# Patient Record
Sex: Female | Born: 1990 | Race: Black or African American | Hispanic: No | Marital: Single | Smoking: Former smoker
Health system: Southern US, Community
[De-identification: ages and names within clinical notes are randomized; demographics above are authoritative.]

## PROBLEM LIST (undated history)

## (undated) ENCOUNTER — Inpatient Hospital Stay (HOSPITAL_COMMUNITY): Payer: Self-pay

## (undated) DIAGNOSIS — F329 Major depressive disorder, single episode, unspecified: Secondary | ICD-10-CM

## (undated) DIAGNOSIS — Z9289 Personal history of other medical treatment: Secondary | ICD-10-CM

## (undated) DIAGNOSIS — A749 Chlamydial infection, unspecified: Secondary | ICD-10-CM

## (undated) DIAGNOSIS — E669 Obesity, unspecified: Secondary | ICD-10-CM

## (undated) DIAGNOSIS — F32A Depression, unspecified: Secondary | ICD-10-CM

## (undated) DIAGNOSIS — Z8619 Personal history of other infectious and parasitic diseases: Secondary | ICD-10-CM

## (undated) DIAGNOSIS — Z8614 Personal history of Methicillin resistant Staphylococcus aureus infection: Secondary | ICD-10-CM

## (undated) DIAGNOSIS — IMO0002 Reserved for concepts with insufficient information to code with codable children: Secondary | ICD-10-CM

## (undated) DIAGNOSIS — N390 Urinary tract infection, site not specified: Secondary | ICD-10-CM

## (undated) DIAGNOSIS — D649 Anemia, unspecified: Secondary | ICD-10-CM

## (undated) DIAGNOSIS — R059 Cough, unspecified: Secondary | ICD-10-CM

## (undated) DIAGNOSIS — O1490 Unspecified pre-eclampsia, unspecified trimester: Secondary | ICD-10-CM

## (undated) DIAGNOSIS — R05 Cough: Secondary | ICD-10-CM

## (undated) HISTORY — PX: HERNIA REPAIR: SHX51

## (undated) HISTORY — PX: CHOLECYSTECTOMY: SHX55

## (undated) HISTORY — DX: Depression, unspecified: F32.A

## (undated) HISTORY — DX: Personal history of other infectious and parasitic diseases: Z86.19

## (undated) HISTORY — DX: Major depressive disorder, single episode, unspecified: F32.9

## (undated) HISTORY — DX: Unspecified pre-eclampsia, unspecified trimester: O14.90

## (undated) HISTORY — DX: Anemia, unspecified: D64.9

## (undated) HISTORY — DX: Reserved for concepts with insufficient information to code with codable children: IMO0002

---
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
## 2009-10-30 HISTORY — PX: WISDOM TOOTH EXTRACTION: SHX21

## 2009-12-28 HISTORY — PX: DILATION AND CURETTAGE OF UTERUS: SHX78

## 2011-08-08 ENCOUNTER — Encounter (HOSPITAL_COMMUNITY): Payer: Self-pay | Admitting: *Deleted

## 2011-08-08 ENCOUNTER — Inpatient Hospital Stay (HOSPITAL_COMMUNITY)
Admission: AD | Admit: 2011-08-08 | Discharge: 2011-08-09 | Disposition: A | Discharge status: Home / Self Care | Payer: Medicaid Other | Source: Ambulatory Visit | Attending: Obstetrics & Gynecology | Admitting: Obstetrics & Gynecology

## 2011-08-08 DIAGNOSIS — B373 Candidiasis of vulva and vagina: Secondary | ICD-10-CM

## 2011-08-08 DIAGNOSIS — O2390 Unspecified genitourinary tract infection in pregnancy, unspecified trimester: Secondary | ICD-10-CM | POA: Insufficient documentation

## 2011-08-08 DIAGNOSIS — B3731 Acute candidiasis of vulva and vagina: Secondary | ICD-10-CM | POA: Insufficient documentation

## 2011-08-08 DIAGNOSIS — N949 Unspecified condition associated with female genital organs and menstrual cycle: Secondary | ICD-10-CM | POA: Insufficient documentation

## 2011-08-08 LAB — URINALYSIS, ROUTINE W REFLEX MICROSCOPIC
Hgb urine dipstick: NEGATIVE
Leukocytes, UA: NEGATIVE
Nitrite: NEGATIVE

## 2011-08-08 LAB — CBC
HCT: 33.1 % — ABNORMAL LOW (ref 36.0–46.0)
Hemoglobin: 11.1 g/dL — ABNORMAL LOW (ref 12.0–15.0)
MCH: 29.5 pg (ref 26.0–34.0)
MCHC: 33.5 g/dL (ref 30.0–36.0)
MCV: 88 fL (ref 78.0–100.0)

## 2011-08-08 NOTE — Progress Notes (Signed)
PT SAYS THAT SAT AND SUN AM- VAGINA HURTS.   TODAY NOTICED   SLIGHT RED BLEEDING. NO CRAMPING.  PAINFUL TO WALK.

## 2011-08-09 ENCOUNTER — Inpatient Hospital Stay (HOSPITAL_COMMUNITY): Payer: Medicaid Other

## 2011-08-09 LAB — GC/CHLAMYDIA PROBE AMP, GENITAL
Chlamydia, DNA Probe: NEGATIVE
GC Probe Amp, Genital: NEGATIVE

## 2011-08-09 LAB — WET PREP, GENITAL: Yeast Wet Prep HPF POC: NONE SEEN

## 2011-08-09 MED ORDER — FLUCONAZOLE 150 MG PO TABS: 150.0000 mg | ORAL_TABLET | Freq: Once | ORAL | Status: AC

## 2011-08-09 NOTE — ED Provider Notes (Signed)
Attestation of Attending Supervision of Advanced Practitioner: Evaluation and management procedures were performed by the PA/NP/CNM/OB Fellow under my supervision/collaboration. Chart reviewed and agree with management and plan.  Philopateer Strine A 08/09/2011 7:55 AM

## 2011-08-09 NOTE — Progress Notes (Signed)
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

## 2011-08-09 NOTE — ED Provider Notes (Signed)
History   Pt presents today c/o vag spotting that began earlier today and vag pain. She denies fever, vag dc, or abd pain. She denies recent intercourse. She is currently 19.2wks.  No chief complaint on file.  HPI  OB History    Grav Para Term Preterm Abortions TAB SAB Ect Mult Living   3    2  2    0      No past medical history on file.  No past surgical history on file.  No family history on file.  History  Substance Use Topics  . Smoking status: Not on file  . Smokeless tobacco: Not on file  . Alcohol Use: Not on file    Allergies: No Known Allergies  Prescriptions prior to admission  Medication Sig Dispense Refill  . prenatal vitamin w/FE, FA (PRENATAL 1 + 1) 27-1 MG TABS Take 1 tablet by mouth daily.          Review of Systems  Constitutional: Negative for fever.  Cardiovascular: Negative for chest pain.  Gastrointestinal: Negative for nausea, vomiting, abdominal pain, diarrhea and constipation.  Genitourinary: Negative for dysuria, urgency, frequency and hematuria.  Neurological: Negative for dizziness and headaches.  Psychiatric/Behavioral: Negative for depression and suicidal ideas.   Physical Exam   Blood pressure 125/43, pulse 89, temperature 98.4 F (36.9 C), temperature source Oral, height 5' (1.524 m), weight 245 lb 4 oz (111.245 kg).  Physical Exam  Constitutional: She is oriented to person, place, and time. She appears well-developed and well-nourished. No distress.  HENT:  Head: Normocephalic and atraumatic.  Eyes: EOM are normal. Pupils are equal, round, and reactive to light.  GI: Soft. She exhibits no distension. There is no tenderness. There is no rebound and no guarding.  Genitourinary: No bleeding around the vagina. Vaginal discharge found.       Cervix Lg/closed. Thick yellow/green vag dc present.  Neurological: She is alert and oriented to person, place, and time.  Skin: Skin is warm and dry. She is not diaphoretic.  Psychiatric: She has  a normal mood and affect. Her behavior is normal. Judgment and thought content normal.    MAU Course  Procedures  Wet prep, GC/Chlamydia cultures done.  Results for orders placed during the hospital encounter of 08/08/11 (from the past 24 hour(s))  CBC     Status: Abnormal   Collection Time   08/08/11 10:06 PM      Component Value Range   WBC 9.9  4.0 - 10.5 (K/uL)   RBC 3.76 (*) 3.87 - 5.11 (MIL/uL)   Hemoglobin 11.1 (*) 12.0 - 15.0 (g/dL)   HCT 29.5 (*) 62.1 - 46.0 (%)   MCV 88.0  78.0 - 100.0 (fL)   MCH 29.5  26.0 - 34.0 (pg)   MCHC 33.5  30.0 - 36.0 (g/dL)   RDW 30.8  65.7 - 84.6 (%)   Platelets 202  150 - 400 (K/uL)  ABO/RH     Status: Normal   Collection Time   08/08/11 10:06 PM      Component Value Range   ABO/RH(D) A POS    URINALYSIS, ROUTINE W REFLEX MICROSCOPIC     Status: Abnormal   Collection Time   08/08/11 10:46 PM      Component Value Range   Color, Urine YELLOW  YELLOW    Appearance CLEAR  CLEAR    Specific Gravity, Urine >1.030 (*) 1.005 - 1.030    pH 6.0  5.0 - 8.0    Glucose,  UA NEGATIVE  NEGATIVE (mg/dL)   Hgb urine dipstick NEGATIVE  NEGATIVE    Bilirubin Urine NEGATIVE  NEGATIVE    Ketones, ur 15 (*) NEGATIVE (mg/dL)   Protein, ur NEGATIVE  NEGATIVE (mg/dL)   Urobilinogen, UA 0.2  0.0 - 1.0 (mg/dL)   Nitrite NEGATIVE  NEGATIVE    Leukocytes, UA NEGATIVE  NEGATIVE   WET PREP, GENITAL     Status: Abnormal   Collection Time   08/09/11 12:01 AM      Component Value Range   Yeast, Wet Prep NONE SEEN  NONE SEEN    Trich, Wet Prep NONE SEEN  NONE SEEN    Clue Cells, Wet Prep NONE SEEN  NONE SEEN    WBC, Wet Prep HPF POC MODERATE (*) NONE SEEN    US shows single IUP with no previa or abruption noted. Assessment and Plan  Preg: discussed with pt at length. Discussed diet, activity, risks, and precautions. She will begin prenatal care. Will tx yeast prophylactically as vag dc is clinically consistent with yeast. Will give Rx for diflucan.   Clinton Gallant.  Jairy Angulo III, DrHSc, MPAS, PA-C  08/09/2011, 12:09 AM   Henrietta Hoover, PA 08/09/11 1610

## 2011-10-25 ENCOUNTER — Inpatient Hospital Stay (HOSPITAL_COMMUNITY)
Admission: AD | Admit: 2011-10-25 | Discharge: 2011-10-25 | Disposition: A | Discharge status: Home / Self Care | Payer: Medicaid Other | Source: Ambulatory Visit | Attending: Obstetrics and Gynecology | Admitting: Obstetrics and Gynecology

## 2011-10-25 ENCOUNTER — Encounter (HOSPITAL_COMMUNITY): Payer: Self-pay

## 2011-10-25 DIAGNOSIS — W010XXA Fall on same level from slipping, tripping and stumbling without subsequent striking against object, initial encounter: Secondary | ICD-10-CM | POA: Insufficient documentation

## 2011-10-25 DIAGNOSIS — O368190 Decreased fetal movements, unspecified trimester, not applicable or unspecified: Secondary | ICD-10-CM | POA: Diagnosis present

## 2011-10-25 DIAGNOSIS — J45909 Unspecified asthma, uncomplicated: Secondary | ICD-10-CM | POA: Diagnosis present

## 2011-10-25 DIAGNOSIS — O99891 Other specified diseases and conditions complicating pregnancy: Secondary | ICD-10-CM | POA: Insufficient documentation

## 2011-10-25 DIAGNOSIS — E669 Obesity, unspecified: Secondary | ICD-10-CM | POA: Diagnosis present

## 2011-10-25 NOTE — Progress Notes (Signed)
S: pt feels lots of FM now, no other c/o O: FHR 150 with accels, no decels CAT I toco quiet A: s/p fall, EFM x4 hours is reassuring P: d/c home, [facility], f/u as scheduled at office

## 2011-10-25 NOTE — ED Notes (Signed)
History   20 yo g1p0 EDC 3/3 transfer of care with no records available, presents from the office c/o fell at 1130 slipped because it was wet outside and landed on my butt. No c/o of srom, vag bleeding, with decreased fm.  pregnancy significant for:  obesity  asthma Chief Complaint  Patient presents with  . Fall     OB History    Grav Para Term Preterm Abortions TAB SAB Ect Mult Living   3 0 0 0 2 0 2 0 0 0       Past Medical History  Diagnosis Date  . Asthma     Past Surgical History  Procedure Date  . No past surgeries     History reviewed. No pertinent family history.  History  Substance Use Topics  . Smoking status: Never Smoker   . Smokeless tobacco: Not on file  . Alcohol Use: No    Allergies: No Known Allergies  Prescriptions prior to admission  Medication Sig Dispense Refill  . prenatal vitamin w/FE, FA (PRENATAL 1 + 1) 27-1 MG TABS Take 1 tablet by mouth daily.           Physical Exam   Blood pressure 106/55, pulse 96, temperature 96.7 F (35.9 C), temperature source Oral, resp. rate 18, height 5' (1.524 m), weight 114.76 kg (253 lb).  abd soft, gravid, nt Fhts 140s LTV mod UC none Vag not assessed +1 edema lower legs  ED Course  Assessment:  30 3/7 week IUP post fall P prolonged monitoring. Laura Austin, CNM

## 2011-10-25 NOTE — Progress Notes (Signed)
Patient states slipped and fell on bottom this morning. Denies vaginal bleeding or leaking of fluid but does states some decreased fetal movement since fall.

## 2011-10-25 NOTE — Progress Notes (Signed)
Pt stated she slipped and fell on her back side this morning around 1100 am denies Hitting her stomach. Went to her normal prenatal visit and told CNM that she had not felt the baby move much since the fall. They put baby on monitor and FHR was "low" and she was told to go to the hospital for further monitoring.

## 2011-12-29 ENCOUNTER — Encounter (HOSPITAL_COMMUNITY): Payer: Self-pay | Admitting: *Deleted

## 2011-12-29 ENCOUNTER — Observation Stay (HOSPITAL_COMMUNITY)
Admission: AD | Admit: 2011-12-29 | Discharge: 2011-12-30 | Disposition: A | Discharge status: Home / Self Care | Payer: Medicaid Other | Source: Ambulatory Visit | Attending: Obstetrics and Gynecology | Admitting: Obstetrics and Gynecology

## 2011-12-29 ENCOUNTER — Inpatient Hospital Stay (HOSPITAL_COMMUNITY): Payer: Medicaid Other

## 2011-12-29 ENCOUNTER — Other Ambulatory Visit: Payer: Self-pay

## 2011-12-29 DIAGNOSIS — O99891 Other specified diseases and conditions complicating pregnancy: Secondary | ICD-10-CM | POA: Insufficient documentation

## 2011-12-29 DIAGNOSIS — E669 Obesity, unspecified: Secondary | ICD-10-CM

## 2011-12-29 DIAGNOSIS — Z22330 Carrier of Group B streptococcus: Secondary | ICD-10-CM | POA: Insufficient documentation

## 2011-12-29 DIAGNOSIS — J45909 Unspecified asthma, uncomplicated: Secondary | ICD-10-CM | POA: Diagnosis present

## 2011-12-29 DIAGNOSIS — IMO0002 Reserved for concepts with insufficient information to code with codable children: Secondary | ICD-10-CM

## 2011-12-29 DIAGNOSIS — O368390 Maternal care for abnormalities of the fetal heart rate or rhythm, unspecified trimester, not applicable or unspecified: Secondary | ICD-10-CM | POA: Insufficient documentation

## 2011-12-29 DIAGNOSIS — O0930 Supervision of pregnancy with insufficient antenatal care, unspecified trimester: Secondary | ICD-10-CM

## 2011-12-29 DIAGNOSIS — O368190 Decreased fetal movements, unspecified trimester, not applicable or unspecified: Principal | ICD-10-CM | POA: Insufficient documentation

## 2011-12-29 LAB — HEPATITIS B SURFACE ANTIGEN: Hepatitis B Surface Ag: NEGATIVE

## 2011-12-29 LAB — RUBELLA ANTIBODY, IGM: Rubella: IMMUNE

## 2011-12-29 MED ORDER — CALCIUM CARBONATE ANTACID 500 MG PO CHEW: 2.0000 | CHEWABLE_TABLET | ORAL | Status: DC | PRN

## 2011-12-29 MED ORDER — DOCUSATE SODIUM 100 MG PO CAPS
100.0000 mg | ORAL_CAPSULE | Freq: Every day | ORAL | Status: DC
  Administered 2011-12-30: 100 mg via ORAL
  Filled 2011-12-29: qty 1

## 2011-12-29 MED ORDER — ZOLPIDEM TARTRATE 10 MG PO TABS
10.0000 mg | ORAL_TABLET | Freq: Every evening | ORAL | Status: DC | PRN
  Administered 2011-12-29: 10 mg via ORAL
  Filled 2011-12-29: qty 1

## 2011-12-29 MED ORDER — ACETAMINOPHEN 325 MG PO TABS: 650.0000 mg | ORAL_TABLET | ORAL | Status: DC | PRN

## 2011-12-29 MED ORDER — PRENATAL MULTIVITAMIN CH
1.0000 | ORAL_TABLET | Freq: Every day | ORAL | Status: DC
  Filled 2011-12-29: qty 1

## 2011-12-29 NOTE — ED Provider Notes (Signed)
History     No chief complaint on file.  HPI Comments: Pt is a G3P0 at [redacted]w[redacted]d with c/o decreased FM. Pt states baby only moves when she eats or drinks and this is different than usual. Pt denies any ctx, VB or LOF, Pt was to have a colpo at 36wks but has not had an appt since 35wks. Pt states she was waiting for office to call.  Pregnancy significant for: 1. LTC/lapse in care 2. +GBS in urine 3. abnl pap - needs colpo 4. Asthma      Past Medical History  Diagnosis Date  . Asthma     Past Surgical History  Procedure Date  . No past surgeries     Family History  Problem Relation Age of Onset  . Hypertension Mother   . Stroke Maternal Grandmother   . Diabetes Maternal Grandfather   . Cancer Paternal Grandfather     History  Substance Use Topics  . Smoking status: Former Smoker    Quit date: 02/28/2011  . Smokeless tobacco: Not on file  . Alcohol Use: No    Allergies: No Known Allergies  Prescriptions prior to admission  Medication Sig Dispense Refill  . Prenatal Vit-Fe Fumarate-FA (PRENATAL MULTIVITAMIN) TABS Take 1 tablet by mouth daily.        Review of Systems  All other systems reviewed and are negative.   Physical Exam   Blood pressure 110/81, pulse 104, temperature 98.7 F (37.1 C), temperature source Oral, resp. rate 18, height 5' (1.524 m), weight 121.677 kg (268 lb 4 oz), SpO2 98.00%.  Physical Exam  Nursing note and vitals reviewed. Constitutional: She is oriented to person, place, and time. She appears well-developed and well-nourished. No distress.  HENT:  Head: Normocephalic.  Neck: Normal range of motion.  Cardiovascular: Normal rate.   Respiratory: Effort normal.  GI: Soft.  Genitourinary:       deferred  Musculoskeletal: Normal range of motion.  Neurological: She is alert and oriented to person, place, and time.  Skin: Skin is warm and dry.  Psychiatric: She has a normal mood and affect. Her behavior is normal.   FHR 140  non-reactive 1 15x15 accel toco quiet MAU Course  Procedures    Assessment and Plan  IUP at [redacted]w[redacted]d Non-reactive NST - will get BPP and check AFI +GBS   Locke Barrell M 12/29/2011, 6:03 PM
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
## 2011-12-30 ENCOUNTER — Observation Stay (HOSPITAL_COMMUNITY): Payer: Medicaid Other

## 2011-12-30 NOTE — Discharge Instructions (Signed)
Gestational Diabetes Mellitus Gestational diabetes mellitus (GDM) is diabetes that occurs only during pregnancy. This happens when the body cannot properly handle the glucose (sugar) that increases in the blood after eating. During pregnancy, insulin resistance (reduced sensitivity to insulin) occurs because of the release of hormones from the placenta. Usually, the pancreas of pregnant women produces enough insulin to overcome the resistance that occurs. However, in gestational diabetes, the insulin is there but it does not work effectively. If the resistance is severe enough that the pancreas does not produce enough insulin, extra glucose builds up in the blood.  WHO IS AT RISK FOR DEVELOPING GESTATIONAL DIABETES?  Women with a history of diabetes in the family.   Women over age 25.   Women who are overweight.   Women in certain ethnic groups (Hispanic, African American, Native American, Asian and Pacific Islander).  WHAT CAN HAPPEN TO THE BABY? If the mother's blood glucose is too high while she is pregnant, the extra sugar will travel through the umbilical cord to the baby. Some of the problems the baby may have are:  Large Baby - If the baby receives too much sugar, the baby will gain more weight. This may cause the baby to be too large to be born normally (vaginally) and a Cesarean section (C-section) may be needed.   Low Blood Glucose (hypoglycemia) - The baby makes extra insulin, in response to the extra sugar its gets from its mother. When the baby is born and no longer needs this extra insulin, the baby's blood glucose level may drop.   Jaundice (yellow coloring of the skin and eyes) - This is fairly common in babies. It is caused from a build-up of the chemical called bilirubin. This is rarely serious, but is seen more often in babies whose mothers had gestational diabetes.  RISKS TO THE MOTHER Women who have had gestational diabetes may be at higher risk for some problems,  including:  Preeclampsia or toxemia, which includes problems with high blood pressure. Blood pressure and protein levels in the urine must be checked frequently.   Infections.   Cesarean section (C-section) for delivery.   Developing Type 2 diabetes later in life. About 30-50% will develop diabetes later, especially if obese.  DIAGNOSIS  The hormones that cause insulin resistance are highest at about 24-28 weeks of pregnancy. If symptoms are experienced, they are much like symptoms you would normally expect during pregnancy.  GDM is often diagnosed using a two part method: 1. After 24-28 weeks of pregnancy, the woman drinks a glucose solution and takes a blood test. If the glucose level is high, a second test will be given.  2. Oral Glucose Tolerance Test (OGTT) which is 3 hours long - After not eating overnight, the blood glucose is checked. The woman drinks a glucose solution, and hourly blood glucose tests are taken.  If the woman has risk factors for GDM, the caregiver may test earlier than 24 weeks of pregnancy. TREATMENT  Treatment of GDM is directed at keeping the mother's blood glucose level normal, and may include:  Meal planning.   Taking insulin or other medicine to control your blood glucose level.   Exercise.   Keeping a daily record of the foods you eat.   Blood glucose monitoring and keeping a record of your blood glucose levels.   May monitor ketone levels in the urine, although this is no longer considered necessary in most pregnancies.  HOME CARE INSTRUCTIONS  While you are pregnant:    Follow your caregiver's advice regarding your prenatal appointments, meal planning, exercise, medicines, vitamins, blood and other tests, and physical activities.   Keep a record of your meals, blood glucose tests, and the amount of insulin you are taking (if any). Show this to your caregiver at every prenatal visit.   If you have GDM, you may have problems with hypoglycemia (low  blood glucose). You may suspect this if you become suddenly dizzy, feel shaky, and/or weak. If you think this is happening and you have a glucose meter, try to test your blood glucose level. Follow your caregiver's advice for when and how to treat your low blood glucose. Generally, the 15:15 rule is followed: Treat by consuming 15 grams of carbohydrates, wait 15 minutes, and recheck blood glucose. Examples of 15 grams of carbohydrates are:   1 cup skim or low-fat milk.    cup juice.   3-4 glucose tablets.   5-6 hard candies.   1 small box raisins.    cup regular soda pop.   Practice good hygiene, to avoid infections.   Do not smoke.  SEEK MEDICAL CARE IF:   You develop abnormal vaginal discharge, with or without itching.   You become weak and tired more than expected.   You seem to sweat a lot.   You have a sudden increase in weight, 5 pounds or more in one week.   You are losing weight, 3 pounds or more in a week.   Your blood glucose level is high, and you need instructions on what to do about it.  SEEK IMMEDIATE MEDICAL CARE IF:   You develop a severe headache.   You faint or pass out.   You develop nausea and vomiting.   You become disoriented or confused.   You have a convulsion.   You develop vision problems.   You develop stomach pain.   You develop vaginal bleeding.   You develop uterine contractions.   You have leaking or a gush of fluid from the vagina.  AFTER YOU HAVE THE BABY:  Go to all of your follow-up appointments, and have blood tests as advised by your caregiver.   Maintain a healthy lifestyle, to prevent diabetes in the future. This includes:   Following a healthy meal plan.   Controlling your weight.   Getting enough exercise and proper rest.   Do not smoke.   Breastfeed your baby if you can. This will lower the chance of you and your baby developing diabetes later in life.  For more information about diabetes, go to the American  Diabetes Association at: PMFashions.com.cy. For more information about gestational diabetes, go to the [employer] at: RentRule.com.au. Document Released: 01/22/2001 Document Revised: 06/28/2011 Document Reviewed: 08/16/2009 [facility] Patient Information 2012 [place], [place].Fetal Movement Counts Patient Name: __________________________________________________ Patient Due Date: ____________________ Melody Haver counts is highly recommended in high risk pregnancies, but it is a good idea for every pregnant woman to do. Start counting fetal movements at 28 weeks of the pregnancy. Fetal movements increase after eating a full meal or eating or drinking something sweet (the blood sugar is higher). It is also important to drink plenty of fluids (well hydrated) before doing the count. Lie on your left side because it helps with the circulation or you can sit in a comfortable chair with your arms over your belly (abdomen) with no distractions around you. DOING THE COUNT  Try to do the count the same time of day each time you  do it.   Loraine Leriche the day and time, then see how long it takes for you to feel 10 movements (kicks, flutters, swishes, rolls). You should have at least 10 movements within 2 hours. You will most likely feel 10 movements in much less than 2 hours. If you do not, wait an hour and count again. After a couple of days you will see a pattern.   What you are looking for is a change in the pattern or not enough counts in 2 hours. Is it taking longer in time to reach 10 movements?  SEEK MEDICAL CARE IF:  You feel less than 10 counts in 2 hours. Tried twice.   No movement in one hour.   The pattern is changing or taking longer each day to reach 10 counts in 2 hours.   You feel the baby is not moving as it usually does.  Date: ____________ Movements: ____________ Start time: ____________ Doreatha Martin time: ____________  Date: ____________ Movements:  ____________ Start time: ____________ Doreatha Martin time: ____________ Date: ____________ Movements: ____________ Start time: ____________ Doreatha Martin time: ____________ Date: ____________ Movements: ____________ Start time: ____________ Doreatha Martin time: ____________ Date: ____________ Movements: ____________ Start time: ____________ Doreatha Martin time: ____________ Date: ____________ Movements: ____________ Start time: ____________ Doreatha Martin time: ____________ Date: ____________ Movements: ____________ Start time: ____________ Doreatha Martin time: ____________ Date: ____________ Movements: ____________ Start time: ____________ Doreatha Martin time: ____________  Date: ____________ Movements: ____________ Start time: ____________ Doreatha Martin time: ____________ Date: ____________ Movements: ____________ Start time: ____________ Doreatha Martin time: ____________ Date: ____________ Movements: ____________ Start time: ____________ Doreatha Martin time: ____________ Date: ____________ Movements: ____________ Start time: ____________ Doreatha Martin time: ____________ Date: ____________ Movements: ____________ Start time: ____________ Doreatha Martin time: ____________ Date: ____________ Movements: ____________ Start time: ____________ Doreatha Martin time: ____________ Date: ____________ Movements: ____________ Start time: ____________ Doreatha Martin time: ____________  Date: ____________ Movements: ____________ Start time: ____________ Doreatha Martin time: ____________ Date: ____________ Movements: ____________ Start time: ____________ Doreatha Martin time: ____________ Date: ____________ Movements: ____________ Start time: ____________ Doreatha Martin time: ____________ Date: ____________ Movements: ____________ Start time: ____________ Doreatha Martin time: ____________ Date: ____________ Movements: ____________ Start time: ____________ Doreatha Martin time: ____________ Date: ____________ Movements: ____________ Start time: ____________ Doreatha Martin time: ____________ Date: ____________ Movements: ____________ Start time: ____________ Doreatha Martin  time: ____________  Date: ____________ Movements: ____________ Start time: ____________ Doreatha Martin time: ____________ Date: ____________ Movements: ____________ Start time: ____________ Doreatha Martin time: ____________ Date: ____________ Movements: ____________ Start time: ____________ Doreatha Martin time: ____________ Date: ____________ Movements: ____________ Start time: ____________ Doreatha Martin time: ____________ Date: ____________ Movements: ____________ Start time: ____________ Doreatha Martin time: ____________ Date: ____________ Movements: ____________ Start time: ____________ Doreatha Martin time: ____________ Date: ____________ Movements: ____________ Start time: ____________ Doreatha Martin time: ____________  Date: ____________ Movements: ____________ Start time: ____________ Doreatha Martin time: ____________ Date: ____________ Movements: ____________ Start time: ____________ Doreatha Martin time: ____________ Date: ____________ Movements: ____________ Start time: ____________ Doreatha Martin time: ____________ Date: ____________ Movements: ____________ Start time: ____________ Doreatha Martin time: ____________ Date: ____________ Movements: ____________ Start time: ____________ Doreatha Martin time: ____________ Date: ____________ Movements: ____________ Start time: ____________ Doreatha Martin time: ____________ Date: ____________ Movements: ____________ Start time: ____________ Doreatha Martin time: ____________  Date: ____________ Movements: ____________ Start time: ____________ Doreatha Martin time: ____________ Date: ____________ Movements: ____________ Start time: ____________ Doreatha Martin time: ____________ Date: ____________ Movements: ____________ Start time: ____________ Doreatha Martin time: ____________ Date: ____________ Movements: ____________ Start time: ____________ Doreatha Martin time: ____________ Date: ____________ Movements: ____________ Start time: ____________ Doreatha Martin time: ____________ Date: ____________ Movements: ____________ Start time: ____________ Doreatha Martin time: ____________ Date: ____________  Movements: ____________ Start time: ____________ Doreatha Martin time: ____________  Date: ____________ Movements: ____________ Start time:  ____________ Doreatha Martin time: ____________ Date: ____________ Movements: ____________ Start time: ____________ Doreatha Martin time: ____________ Date: ____________ Movements: ____________ Start time: ____________ Doreatha Martin time: ____________ Date: ____________ Movements: ____________ Start time: ____________ Doreatha Martin time: ____________ Date: ____________ Movements: ____________ Start time: ____________ Doreatha Martin time: ____________ Date: ____________ Movements: ____________ Start time: ____________ Doreatha Martin time: ____________ Date: ____________ Movements: ____________ Start time: ____________ Doreatha Martin time: ____________  Date: ____________ Movements: ____________ Start time: ____________ Doreatha Martin time: ____________ Date: ____________ Movements: ____________ Start time: ____________ Doreatha Martin time: ____________ Date: ____________ Movements: ____________ Start time: ____________ Doreatha Martin time: ____________ Date: ____________ Movements: ____________ Start time: ____________ Doreatha Martin time: ____________ Date: ____________ Movements: ____________ Start time: ____________ Doreatha Martin time: ____________ Date: ____________ Movements: ____________ Start time: ____________ Doreatha Martin time: ____________ Document Released: 11/15/2006 Document Revised: 06/28/2011 Document Reviewed: 05/18/2009 [employer] Patient Information 2012 [employer].Bariatric Surgery (Gastrointestinal Surgery for Severe Obesity) Severe obesity is a longstanding condition. It is difficult to treat through diet and exercise alone. Gastrointestinal surgery is the best option for people who are severely obese and cannot lose weight by traditional means, or who suffer from serious obesity-related health problems. The surgery promotes weight loss by decreasing the absorption of food and, in some operations, interrupting the digestive process. As in other  treatments for obesity, the best results are achieved with healthy eating behaviors and regular physical activity.  People who may consider gastrointestinal surgery include those with a body mass index (BMI) above 40. This is about 100 pounds of overweight for men and 80 pounds for women. People with a BMI between 35 and 40 and who suffer from type 2 diabetes or life-threatening cardiopulmonary (heart and lung) problems, such as severe sleep apnea or obesity-related heart disease, may also be candidates for surgery. (To use the Body Mass Index chart. find your weight on the bottom of the graph. Go straight up from that point until you come to the line that matches your height. Then look to find your weight group). The idea of gastrointestinal surgery to control obesity grew out of results of operations for cancer or severe ulcers that removed large portions of the stomach or small intestine. Patients undergoing these procedures tended to lose weight after surgery. So some physicians began to use such operations to treat severe obesity. The first operation that was widely used for severe obesity was the intestinal bypass. This operation was first used 40 years ago. It produced weight loss by causing malabsorption. The idea was that patients could eat large amounts of food, which would be poorly digested or passed along too fast for the body to absorb many calories. The problem with this surgery was that it caused a loss of essential nutrients. Also, its side effects were unpredictable and sometimes fatal. The original form of the intestinal bypass operation is no longer used. THE NORMAL DIGESTIVE PROCESS Normally, as food moves along the digestive tract, digestive juices and enzymes digest and absorb calories and nutrients. After we chew and swallow our food, it moves down the esophagus to the stomach. There a strong acid continues the digestive process. The stomach can hold about 3 pints of food at one time.  When the stomach contents move to the first portion of the small intestine (duodenum ), bile and pancreatic juice speed up digestion. Most of the iron and calcium in the foods we eat is absorbed in the duodenum. The jejunum and ileum are the remaining two segments of the nearly 20 feet of small intestine. They complete the absorption of almost all calories and  nutrients. The food particles that cannot be digested in the small intestine are stored in the large intestine until eliminated.  HOW DOES SURGERY PROMOTE WEIGHT LOSS? Gastrointestinal surgery for obesity is also called bariatric surgery. It alters the digestive process. The operations promote weight loss by closing off parts of the stomach. This will make it smaller. Operations that only reduce stomach size are known as "restrictive operations". They restrict the amount of food the stomach can hold. Some operations combine stomach restriction with a partial bypass of the small intestine. These procedures create a direct connection from the stomach to the lower segment of the small intestine. This causes bypassing portions of the digestive tract that absorb calories and nutrients. These are known as malabsorptive operations. WHAT ARE THE SURGICAL OPTIONS? There are several types of restrictive and malabsorptive operations. Each one carries its own benefits and risks.  Restrictive Operations  Restrictive operations serve only to restrict food intake. They do not interfere with the normal digestive process. To perform the surgery, doctors create a small pouch at the top of the stomach where food enters from the esophagus. At first, the pouch holds about 1 ounce of food. It later expands to 2-3 ounces. The lower outlet of the pouch usually has a diameter of only about  inch. This small outlet delays the emptying of food from the pouch and causes a feeling of fullness. As a result of this surgery, most people lose the ability to eat large amounts of food  at one time. After an operation, the person usually can eat only  to 1 cup of food without discomfort or nausea. Also, food has to be well chewed. Restrictive operations for obesity include adjustable gastric banding (AGB) and vertical banded gastroplasty (VBG).   Adjustable gastric banding  In this procedure, a hollow band made of special material is placed around the stomach near its upper end. This creates a small pouch and a narrow passage into the larger remainder of the stomach. The band is then inflated with a salt solution. It can be tightened or loosened over time to change the size of the passage by increasing or decreasing the amount of salt solution.   The band is adjusted based on feelings of hunger and weight loss. Patients decide when they need an adjustment and come to their surgeons to evaluate this. The adjustment is done as an office visit. The band is fully reversible with a second surgery if the patient changes his/her mind. There is no cutting or re-routing of the intestine.   Vertical banded gastroplasty  VBG has been the most common restrictive operation for weight control. Both a band and staples are used to create a small stomach pouch. Vertical banded gastroplasty is based on the same principle of restriction as the band. But the stomach is surgically altered with the stapling. This treatment is not reversible.   Restrictive operations lead to weight loss in almost all patients. But they are less successful than malabsorptive operations in achieving substantial, long-term weight loss. About 30 percent of those who undergo VBG achieve normal weight. About 80 percent achieve some degree of weight loss. Some patients regain weight. Others are unable to adjust their eating habits and fail to lose the desired weight. Successful results depend on the patient's willingness to adopt a long-term plan of healthy eating and regular physical activity.   A common risk of restrictive  operations is vomiting. This is caused when the small stomach is overly stretched by  food particles that have not been chewed well. Band slippage and saline leakage have been reported after AGB. Risks of VBG include wearing away of the band and breakdown of the staple line. In a small number of cases, stomach juices may leak into the abdomen. This requires an emergency operation. In less than 1 percent of all cases, infection or death from complications may occur.  Malabsorptive Operations  Malabsorptive operations are the most common gastrointestinal surgeries for weight loss. They restrict both food intake and the amount of calories and nutrients the body absorbs.   Roux-en-Y gastric bypass (RGB)  This operation is the most common and successful malabsorptive surgery. First, a small stomach pouch is created to restrict food intake. Next, a Y-shaped section of the small intestine is attached to the pouch. This allows food to bypass the lower stomach, the first segment of the small intestine (duodenum), and the first portion of the jejunum (the second segment of the small intestine). This bypass reduces the amount of calories and nutrients the body absorbs.   Biliopancreatic diversion (BPD)  In this more complicated malabsorptive operation, portions of the stomach are removed. The small pouch that remains is connected directly to the final segment of the small intestine, completely bypassing the duodenum and the jejunum. This procedure successfully promotes weight loss. But it is less frequently used than other types of surgery because of the high risk for nutritional deficiencies. A variation of BPD includes a "duodenal switch". This leaves a larger portion of the stomach intact, including the pyloric valve. This valve regulates the release of stomach contents into the small intestine. It also keeps a small part of the duodenum in the digestive pathway.   Malabsorptive operations produce more weight loss  than restrictive operations. And they are more effective in reversing the health problems associated with severe obesity. Patients who have malabsorptive operations generally lose two-thirds of their excess weight within 2 years.   In addition to the risks of restrictive surgeries, malabsorptive operations also carry greater risk for nutritional deficiencies. This is because the procedure causes food to bypass the duodenum and jejunum. That is where most iron and calcium are absorbed. Menstruating women may develop anemia because not enough vitamin B12 and iron are absorbed. Decreased absorption of calcium may also bring on osteoporosis and metabolic bone disease. Patients are required to take nutritional supplements that usually prevent these deficiencies. Patients who have the biliopancreatic diversion surgery must also take fat-soluble (dissolved by fat) vitamins A, D, E, and K supplements.   RGB and BPD operations may also cause "dumping syndrome". This means that stomach contents move too rapidly through the small intestine. Symptoms include nausea, weakness, sweating, faintness, and sometimes diarrhea after eating. The duodenal switch operation keeps the pyloric valve intact. So it may reduce the likelihood of dumping syndrome.   The more extensive the bypass, the greater the risk is for complications and nutritional deficiencies. Patients with extensive bypasses of the normal digestive process require close monitoring. They also need life-long use of special foods, supplements, and medications.  EXPLORE BENEFITS AND RISKS Surgery to produce weight loss is a serious undertaking. Anyone thinking about surgery should understand what the operation involves. Patients and physicians should carefully consider the following benefits and risks.  Benefits  Right after surgery, most patients lose weight quickly. They continue to lose for 18 to 24 months after the procedure. Most patients regain 5 to 10  percent of the weight they lost. But many maintain a  long-term weight loss of about 100 pounds.   Surgery improves most obesity-related conditions. For example, in one study blood sugar levels of 83 percent of obese patients with diabetes returned to normal after surgery. Nearly all patients whose blood sugar levels did not return to normal were older. Or they had lived with diabetes for a long time.  Risks  Ten to 20 percent of patients who have weight-loss surgery require follow-up operations to correct complications. Abdominal hernia was the most common complication requiring follow-up surgery. But laparoscopic techniques seem to have solved this problem. In laparoscopy, the surgeon makes one or more small incisions. Slender surgical instruments are passed them. This technique eliminates the need for a large incision. And it creates less tissue damage. Patients who are super obese (greater than 350 pounds) or have had previous abdominal surgery, may not be good candidates for laparoscopy. Less common complications include breakdown of the staple line and stretched stomach outlets.   Some obese patients who have weight-loss surgery develop gallstones. These are clumps of cholesterol and other matter that form in the gallbladder. During quick or substantial weight loss, one's risk of developing gallstones increases. Taking supplemental bile salts for the first 6 months after surgery can prevent them.   Nearly 30 percent of patients who have weight-loss surgery develop nutritional deficiencies. These include anemia, osteoporosis, and metabolic bone disease. These usually can be avoided if vitamin and mineral intakes are high enough.   Women of childbearing age should avoid pregnancy until their weight becomes stable. Quick weight loss and nutritional deficiencies can harm a growing fetus.   Other risks of restrictive surgeries include:   Band slippage.   Stomach prolapse.   Band erosion into the  lumen of the stomach.   Port infection.   The main risk with malabsorption operations is life threatening. It is the risk of leak from any of the anastomosis. The more involved the operation, the more risk involved.   There is one other risk of having the surgery. If people do not follow a strict diet, they will stretch out their stomach pouches. Then they will not lose weight.  MEDICAL COSTS Gastrointestinal surgery costs vary. They depend on the procedure. Medical insurance coverage varies by state and insurance provider. If you are considering gastrointestinal surgery, contact your r egional Medicare or Medicaid office or your insurance plan. Find out from them if the procedure is covered. IS THE SURGERY FOR YOU?  Gastrointestinal surgery may be the next step for people who remain severely obese after trying nonsurgical approaches or have an obesity-related disease. Candidates for surgery have:   A BMI of 40 or more.   A BMI of 35 or more and a life-threatening obesity-related health problem such as:   Diabetes.   Severe sleep apnea.   Heart disease.   Obesity-related physical problems that interfere with:   Employment.   Walking.   Family function.  If you fit the profile for surgery, answers to these questions may help you decide whether weight-loss surgery is appropriate for you. Are you:  Unlikely to lose weight successfully without surgery?   Well informed about the surgical procedure? The effects of treatment?   Determined to lose weight? Improve your health?   Aware of how your life may change after the operation? Adjustment to the side effects of the surgery include the need to chew well and being unable to eat large meals.   Aware of the potential for serious complications? Dietary restrictions? Occasional  failures?   Committed to lifelong medical follow-up?   Restrictive operations are very successful with patients who follow a diet created by a dietician.  Support groups and follow up with caregivers is important.  Remember: There are no guarantees for any method to produce and maintain weight loss. This includes surgery. Success is possible only with:  Maximum cooperation.   Commitment to behavioral change.   Medical follow-up.  This cooperation and commitment must be carried out for the rest of your life.  ADDITIONAL RESOURCES [employer] [address] [place], [place] [address] www.asmbs.org  Weight-control Information Network (WIN) [address] [place] [id number] Find[employer].nl Document Released: 10/16/2005 Document Revised: 06/28/2011 Document Reviewed: 01/09/2007 [facility] Patient Information 2012 [place], [place].ABCs of Pregnancy A Antepartum care is very important. Be sure you see your doctor and get prenatal care as soon as you think you are pregnant. At this time, you will be tested for infection, genetic abnormalities and potential problems with you and the pregnancy. This is the time to discuss diet, exercise, work, medications, labor, pain medication during labor and the possibility of a cesarean delivery. Ask any questions that may concern you. It is important to see your doctor regularly throughout your pregnancy. Avoid exposure to toxic substances and chemicals - such as cleaning solvents, lead and mercury, some insecticides, and paint. Pregnant women should avoid exposure to paint fumes, and fumes that cause you to feel ill, dizzy or faint. When possible, it is a good idea to have a pre-pregnancy consultation with your caregiver to begin some important recommendations your caregiver suggests such as, taking folic acid, exercising, quitting smoking, avoiding alcoholic beverages, etc. B Breastfeeding is the healthiest choice for both you and your baby. It has many nutritional benefits for the baby and health benefits for the mother. It also creates a  very tight and loving bond between the baby and mother. Talk to your doctor, your family and friends, and your employer about how you choose to feed your baby and how they can support you in your decision. Not all birth defects can be prevented, but a woman can take actions that may increase her chance of having a healthy baby. Many birth defects happen very early in pregnancy, sometimes before a woman even knows she is pregnant. Birth defects or abnormalities of any child in your or the father's family should be discussed with your caregiver. Get a good support bra as your breast size changes. Wear it especially when you exercise and when nursing.  C Celebrate the news of your pregnancy with the your spouse/father and family. Childbirth classes are helpful to take for you and the spouse/father because it helps to understand what happens during the pregnancy, labor and delivery. Cesarean delivery should be discussed with your doctor so you are prepared for that possibility. The pros and cons of circumcision if it is a boy, should be discussed with your pediatrician. Cigarette smoking during pregnancy can result in low birth weight babies. It has been associated with infertility, miscarriages, tubal pregnancies, infant death (mortality) and poor health (morbidity) in childhood. Additionally, cigarette smoking may cause long-term learning disabilities. If you smoke, you should try to quit before getting pregnant and not smoke during the pregnancy. Secondary smoke may also harm a mother and her developing baby. It is a good idea to ask people to stop smoking around you during your pregnancy and after the baby is born. Extra calcium is necessary when  you are pregnant and is found in your prenatal vitamin, in dairy products, green leafy vegetables and in calcium supplements. D A healthy diet according to your current weight and height, along with vitamins and mineral supplements should be discussed with your  caregiver. Domestic abuse or violence should be made known to your doctor right away to get the situation corrected. Drink more water when you exercise to keep hydrated. Discomfort of your back and legs usually develops and progresses from the middle of the second trimester through to delivery of the baby. This is because of the enlarging baby and uterus, which may also affect your balance. Do not take illegal drugs. Illegal drugs can seriously harm the baby and you. Drink extra fluids (water is best) throughout pregnancy to help your body keep up with the increases in your blood volume. Drink at least 6 to 8 glasses of water, fruit juice, or milk each day. A good way to know you are drinking enough fluid is when your urine looks almost like clear water or is very light yellow.  E Eat healthy to get the nutrients you and your unborn baby need. Your meals should include the five basic food groups. Exercise (30 minutes of light to moderate exercise a day) is important and encouraged during pregnancy, if there are no medical problems or problems with the pregnancy. Exercise that causes discomfort or dizziness should be stopped and reported to your caregiver. Emotions during pregnancy can change from being ecstatic to depression and should be understood by you, your partner and your family. F Fetal screening with ultrasound, amniocentesis and monitoring during pregnancy and labor is common and sometimes necessary. Take 400 micrograms of folic acid daily both before, when possible, and during the first few months of pregnancy to reduce the risk of birth defects of the brain and spine. All women who could possibly become pregnant should take a vitamin with folic acid, every day. It is also important to eat a healthy diet with fortified foods (enriched grain products, including cereals, rice, breads, and pastas) and foods with natural sources of folate (orange juice, green leafy vegetables, beans, peanuts, broccoli,  asparagus, peas, and lentils). The father should be involved with all aspects of the pregnancy including, the prenatal care, childbirth classes, labor, delivery, and postpartum time. Fathers may also have emotional concerns about being a father, financial needs, and raising a family. G Genetic testing should be done appropriately. It is important to know your family and the father's history. If there have been problems with pregnancies or birth defects in your family, report these to your doctor. Also, genetic counselors can talk with you about the information you might need in making decisions about having a family. You can call a major medical center in your area for help in finding a board-certified genetic counselor. Genetic testing and counseling should be done before pregnancy when possible, especially if there is a history of problems in the mother's or father's family. Certain ethnic backgrounds are more at risk for genetic defects. H Get familiar with the hospital where you will be having your baby. Get to know how long it takes to get there, the labor and delivery area, and the hospital procedures. Be sure your medical insurance is accepted there. Get your home ready for the baby including, clothes, the baby's room (when possible), furniture and car seat. Hand washing is important throughout the day, especially after handling raw meat and poultry, changing the baby's diaper or using the bathroom.  This can help prevent the spread of many bacteria and viruses that cause infection. Your hair may become dry and thinner, but will return to normal a few weeks after the baby is born. Heartburn is a common problem that can be treated by taking antacids recommended by your caregiver, eating smaller meals 5 or 6 times a day, not drinking liquids when eating, drinking between meals and raising the head of your bed 2 to 3 inches. I Insurance to cover you, the baby, doctor and hospital should be reviewed so that  you will be prepared to pay any costs not covered by your insurance plan. If you do not have medical insurance, there are usually clinics and services available for you in your community. Take 30 milligrams of iron during your pregnancy as prescribed by your doctor to reduce the risk of low red blood cells (anemia) later in pregnancy. All women of childbearing age should eat a diet rich in iron. J There should be a joint effort for the mother, father and any other children to adapt to the pregnancy financially, emotionally, and psychologically during the pregnancy. Join a support group for moms-to-be. Or, join a class on parenting or childbirth. Have the family participate when possible. K Know your limits. Let your caregiver know if you experience any of the following:   Pain of any kind.   Strong cramps.   You develop a lot of weight in a short period of time (5 pounds in 3 to 5 days).   Vaginal bleeding, leaking of amniotic fluid.   Headache, vision problems.   Dizziness, fainting, shortness of breath.   Chest pain.   Fever of 102 F (38.9 C) or higher.   Gush of clear fluid from your vagina.   Painful urination.   Domestic violence.   Irregular heartbeat (palpitations).   Rapid beating of the heart (tachycardia).   Constant feeling sick to your stomach (nauseous) and vomiting.   Trouble walking, fluid retention (edema).   Muscle weakness.   If your baby has decreased activity.   Persistent diarrhea.   Abnormal vaginal discharge.   Uterine contractions at 20-minute intervals.   Back pain that travels down your leg.  L Learn and practice that what you eat and drink should be in moderation and healthy for you and your baby. Legal drugs such as alcohol and caffeine are important issues for pregnant women. There is no safe amount of alcohol a woman can drink while pregnant. Fetal alcohol syndrome, a disorder characterized by growth retardation, facial abnormalities,  and central nervous system dysfunction, is caused by a woman's use of alcohol during pregnancy. Caffeine, found in tea, coffee, soft drinks and chocolate, should also be limited. Be sure to read labels when trying to cut down on caffeine during pregnancy. More than 200 foods, beverages, and over-the-counter medications contain caffeine and have a high salt content! There are coffees and teas that do not contain caffeine. M Medical conditions such as diabetes, epilepsy, and high blood pressure should be treated and kept under control before pregnancy when possible, but especially during pregnancy. Ask your caregiver about any medications that may need to be changed or adjusted during pregnancy. If you are currently taking any medications, ask your caregiver if it is safe to take them while you are pregnant or before getting pregnant when possible. Also, be sure to discuss any herbs or vitamins you are taking. They are medicines, too! Discuss with your doctor all medications, prescribed and over-the-counter, that  you are taking. During your prenatal visit, discuss the medications your doctor may give you during labor and delivery. N Never be afraid to ask your doctor or caregiver questions about your health, the progress of the pregnancy, family problems, stressful situations, and recommendation for a pediatrician, if you do not have one. It is better to take all precautions and discuss any questions or concerns you may have during your office visits. It is a good idea to write down your questions before you visit the doctor. O Over-the-counter cough and cold remedies may contain alcohol or other ingredients that should be avoided during pregnancy. Ask your caregiver about prescription, herbs or over-the-counter medications that you are taking or may consider taking while pregnant.  P Physical activity during pregnancy can benefit both you and your baby by lessening discomfort and fatigue, providing a sense of  well-being, and increasing the likelihood of early recovery after delivery. Light to moderate exercise during pregnancy strengthens the belly (abdominal) and back muscles. This helps improve posture. Practicing yoga, walking, swimming, and cycling on a stationary bicycle are usually safe exercises for pregnant women. Avoid scuba diving, exercise at high altitudes (over 3000 feet), skiing, horseback riding, contact sports, etc. Always check with your doctor before beginning any kind of exercise, especially during pregnancy and especially if you did not exercise before getting pregnant. Q Queasiness, stomach upset and morning sickness are common during pregnancy. Eating a couple of crackers or dry toast before getting out of bed. Foods that you normally love may make you feel sick to your stomach. You may need to substitute other nutritious foods. Eating 5 or 6 small meals a day instead of 3 large ones may make you feel better. Do not drink with your meals, drink between meals. Questions that you have should be written down and asked during your prenatal visits. R Read about and make plans to baby-proof your home. There are important tips for making your home a safer environment for your baby. Review the tips and make your home safer for you and your baby. Read food labels regarding calories, salt and fat content in the food. S Saunas, hot tubs, and steam rooms should be avoided while you are pregnant. Excessive high heat may be harmful during your pregnancy. Your caregiver will screen and examine you for sexually transmitted diseases and genetic disorders during your prenatal visits. Learn the signs of labor. Sexual relations while pregnant is safe unless there is a medical or pregnancy problem and your caregiver advises against it. T Traveling long distances should be avoided especially in the third trimester of your pregnancy. If you do have to travel out of state, be sure to take a copy of your medical  records and medical insurance plan with you. You should not travel long distances without seeing your doctor first. Most airlines will not allow you to travel after 36 weeks of pregnancy. Toxoplasmosis is an infection caused by a parasite that can seriously harm an unborn baby. Avoid eating undercooked meat and handling cat litter. Be sure to wear gloves when gardening. Tingling of the hands and fingers is not unusual and is due to fluid retention. This will go away after the baby is born. U Womb (uterus) size increases during the first trimester. Your kidneys will begin to function more efficiently. This may cause you to feel the need to urinate more often. You may also leak urine when sneezing, coughing or laughing. This is due to the growing uterus pressing against  your bladder, which lies directly in front of and slightly under the uterus during the first few months of pregnancy. If you experience burning along with frequency of urination or bloody urine, be sure to tell your doctor. The size of your uterus in the third trimester may cause a problem with your balance. It is advisable to maintain good posture and avoid wearing high heels during this time. An ultrasound of your baby may be necessary during your pregnancy and is safe for you and your baby. V Vaccinations are an important concern for pregnant women. Get needed vaccines before pregnancy. Center for Disease Control ([employer]Ex[employer].com.br) has clear guidelines for the use of vaccines during pregnancy. Review the list, be sure to discuss it with your doctor. Prenatal vitamins are helpful and healthy for you and the baby. Do not take extra vitamins except what is recommended. Taking too much of certain vitamins can cause overdose problems. Continuous vomiting should be reported to your caregiver. Varicose veins may appear especially if there is a family history of varicose veins. They should subside after the delivery of the baby. Support hose helps if there  is leg discomfort. W Being overweight or underweight during pregnancy may cause problems. Try to get within 15 pounds of your ideal weight before pregnancy. Remember, pregnancy is not a time to be dieting! Do not stop eating or start skipping meals as your weight increases. Both you and your baby need the calories and nutrition you receive from a healthy diet. Be sure to consult with your doctor about your diet. There is a formula and diet plan available depending on whether you are overweight or underweight. Your caregiver or nutritionist can help and advise you if necessary. X Avoid X-rays. If you must have dental work or diagnostic tests, tell your dentist or physician that you are pregnant so that extra care can be taken. X-rays should only be taken when the risks of not taking them outweigh the risk of taking them. If needed, only the minimum amount of radiation should be used. When X-rays are necessary, protective lead shields should be used to cover areas of the body that are not being X-rayed. Y Your baby loves you. Breastfeeding your baby creates a loving and very close bond between the two of you. Give your baby a healthy environment to live in while you are pregnant. Infants and children require constant care and guidance. Their health and safety should be carefully watched at all times. After the baby is born, rest or take a nap when the baby is sleeping. Z Get your ZZZs. Be sure to get plenty of rest. Resting on your side as often as possible, especially on your left side is advised. It provides the best circulation to your baby and helps reduce swelling. Try taking a nap for 30 to 45 minutes in the afternoon when possible. After the baby is born rest or take a nap when the baby is sleeping. Try elevating your feet for that amount of time when possible. It helps the circulation in your legs and helps reduce swelling.  Most information courtesy of the CDC. Document Released: 10/16/2005 Document  Revised: 06/28/2011 Document Reviewed: 06/30/2009 [facility] Patient Information 2012 [place], [place].

## 2011-12-30 NOTE — Progress Notes (Signed)
Laura Austin is a 21 y.o. G3P0020 at [redacted]w[redacted]d admitted for observation of FHT.  Subjective:  Doing well.  Objective: BP 123/80  Pulse 111  Temp(Src) 98.4 F (36.9 C) (Oral)  Resp 18  Ht 5' (1.524 m)  Wt 121.564 kg (268 lb)  BMI 52.34 kg/m2  SpO2 98%     FHT:  Cat 1 UC:   few SVE:   Dilation: Closed Effacement (%): Thick Station: Ballotable Exam by:: Lerry Liner, CNM  Labs: Lab Results  Component Value Date   WBC 9.9 08/08/2011   HGB 11.1* 08/08/2011   HCT 33.1* 08/08/2011   MCV 88.0 08/08/2011   PLT 202 08/08/2011   Ultrasound: BPP 8/8 today.  Assessment / Plan: Fetal status now reassuring.  Labor: No labor Preeclampsia:  no signs or symptoms of toxicity Fetal Wellbeing:  Category I Pain Control:  No labor I/D:  n/a Anticipated MOD:  Awaiting labor due to nonfavorable cervix.  RTO in 2-3 days. Kick counts.  Laura Austin V 12/30/2011, 12:02 PM

## 2011-12-30 NOTE — Progress Notes (Signed)
Pt sitting up eating breakfast--difficult to trace--will continue to assess--orders for BPP to determine d/c status

## 2011-12-30 NOTE — Progress Notes (Signed)
21 y.o. year old female,at [redacted]w[redacted]d gestation.  SUBJECTIVE:  The patient reports that she slept well. She denies vaginal bleeding and leakage of fluid. Her baby is actively moving this morning but not as much as it did earlier in the pregnancy.  OBJECTIVE:  BP 123/80  Pulse 111  Temp(Src) 98.4 F (36.9 C) (Oral)  Resp 18  Ht 5' (1.524 m)  Wt 121.564 kg (268 lb)  BMI 52.34 kg/m2  SpO2 98%  Fetal Heart Tones:  The patient continues to have occasional variable decelerations. Variability is 2-4 beats per minute. There are times when there is less variability. Accelerations are noted.  Contractions:          None  Abdomen is nontender  ASSESSMENT:  [redacted]w[redacted]d Weeks Pregnancy  Questionable fetal well-being on admission. Variable decelerations are again observed.  PLAN:  We will repeat the biophysical profile today. If the score is 8 out of 8, then the patient will be discharged to home. If the score is less than 8 out of 8, and then we will proceed with induction of labor.  Leonard Schwartz, M.D.

## 2012-01-01 ENCOUNTER — Encounter (INDEPENDENT_AMBULATORY_CARE_PROVIDER_SITE_OTHER): Payer: Medicaid Other | Admitting: Obstetrics and Gynecology

## 2012-01-01 DIAGNOSIS — Z331 Pregnant state, incidental: Secondary | ICD-10-CM

## 2012-01-03 ENCOUNTER — Encounter (HOSPITAL_COMMUNITY): Payer: Self-pay | Admitting: *Deleted

## 2012-01-03 ENCOUNTER — Telehealth (HOSPITAL_COMMUNITY): Payer: Self-pay | Admitting: *Deleted

## 2012-01-03 NOTE — Telephone Encounter (Signed)
Preadmission screen  

## 2012-01-04 ENCOUNTER — Encounter (INDEPENDENT_AMBULATORY_CARE_PROVIDER_SITE_OTHER): Payer: Medicaid Other | Admitting: Obstetrics and Gynecology

## 2012-01-04 ENCOUNTER — Encounter (HOSPITAL_COMMUNITY): Payer: Self-pay

## 2012-01-04 ENCOUNTER — Inpatient Hospital Stay (HOSPITAL_COMMUNITY): Admission: RE | Admit: 2012-01-04 | Payer: Medicaid Other | Source: Ambulatory Visit

## 2012-01-04 ENCOUNTER — Inpatient Hospital Stay (HOSPITAL_COMMUNITY)
Admission: AD | Admit: 2012-01-04 | Discharge: 2012-01-10 | DRG: 765 | Disposition: A | Discharge status: Home / Self Care | Payer: Medicaid Other | Source: Ambulatory Visit | Attending: Obstetrics and Gynecology | Admitting: Obstetrics and Gynecology

## 2012-01-04 DIAGNOSIS — O0930 Supervision of pregnancy with insufficient antenatal care, unspecified trimester: Secondary | ICD-10-CM

## 2012-01-04 DIAGNOSIS — IMO0002 Reserved for concepts with insufficient information to code with codable children: Secondary | ICD-10-CM

## 2012-01-04 DIAGNOSIS — O99893 Other specified diseases and conditions complicating puerperium: Secondary | ICD-10-CM | POA: Diagnosis not present

## 2012-01-04 DIAGNOSIS — Z331 Pregnant state, incidental: Secondary | ICD-10-CM

## 2012-01-04 DIAGNOSIS — O09299 Supervision of pregnancy with other poor reproductive or obstetric history, unspecified trimester: Secondary | ICD-10-CM

## 2012-01-04 DIAGNOSIS — O139 Gestational [pregnancy-induced] hypertension without significant proteinuria, unspecified trimester: Secondary | ICD-10-CM | POA: Diagnosis present

## 2012-01-04 DIAGNOSIS — J811 Chronic pulmonary edema: Secondary | ICD-10-CM | POA: Diagnosis not present

## 2012-01-04 DIAGNOSIS — R87619 Unspecified abnormal cytological findings in specimens from cervix uteri: Secondary | ICD-10-CM

## 2012-01-04 DIAGNOSIS — O99892 Other specified diseases and conditions complicating childbirth: Secondary | ICD-10-CM | POA: Diagnosis present

## 2012-01-04 DIAGNOSIS — O480 Post-term pregnancy: Principal | ICD-10-CM | POA: Diagnosis present

## 2012-01-04 DIAGNOSIS — E669 Obesity, unspecified: Secondary | ICD-10-CM

## 2012-01-04 DIAGNOSIS — J45909 Unspecified asthma, uncomplicated: Secondary | ICD-10-CM | POA: Diagnosis present

## 2012-01-04 DIAGNOSIS — Z22330 Carrier of Group B streptococcus: Secondary | ICD-10-CM

## 2012-01-04 LAB — CBC
Hemoglobin: 10.7 g/dL — ABNORMAL LOW (ref 12.0–15.0)
Platelets: 166 10*3/uL (ref 150–400)
RBC: 3.77 MIL/uL — ABNORMAL LOW (ref 3.87–5.11)
WBC: 7.9 10*3/uL (ref 4.0–10.5)

## 2012-01-04 LAB — RPR: RPR Ser Ql: NONREACTIVE

## 2012-01-04 MED ORDER — CITRIC ACID-SODIUM CITRATE 334-500 MG/5ML PO SOLN
30.0000 mL | ORAL | Status: DC | PRN
  Administered 2012-01-05: 30 mL via ORAL
  Filled 2012-01-04: qty 15

## 2012-01-04 MED ORDER — LACTATED RINGERS IV SOLN
500.0000 mL | INTRAVENOUS | Status: DC | PRN
  Administered 2012-01-05: 500 mL via INTRAVENOUS

## 2012-01-04 MED ORDER — ONDANSETRON HCL 4 MG/2ML IJ SOLN: 4.0000 mg | Freq: Four times a day (QID) | INTRAMUSCULAR | Status: DC | PRN

## 2012-01-04 MED ORDER — LIDOCAINE HCL (PF) 1 % IJ SOLN: 30.0000 mL | INTRAMUSCULAR | Status: DC | PRN

## 2012-01-04 MED ORDER — TERBUTALINE SULFATE 1 MG/ML IJ SOLN: 0.2500 mg | Freq: Once | INTRAMUSCULAR | Status: AC | PRN

## 2012-01-04 MED ORDER — LACTATED RINGERS IV SOLN
INTRAVENOUS | Status: DC
  Administered 2012-01-05 (×2): via INTRAVENOUS

## 2012-01-04 MED ORDER — OXYTOCIN 10 UNIT/ML IJ SOLN: 10.0000 [IU] | Freq: Once | INTRAMUSCULAR | Status: DC

## 2012-01-04 MED ORDER — PENICILLIN G POTASSIUM 5000000 UNITS IJ SOLR
5.0000 10*6.[IU] | Freq: Once | INTRAMUSCULAR | Status: AC
  Administered 2012-01-04: 5 10*6.[IU] via INTRAVENOUS
  Filled 2012-01-04: qty 5

## 2012-01-04 MED ORDER — ACETAMINOPHEN 325 MG PO TABS: 650.0000 mg | ORAL_TABLET | ORAL | Status: DC | PRN

## 2012-01-04 MED ORDER — IBUPROFEN 600 MG PO TABS: 600.0000 mg | ORAL_TABLET | Freq: Four times a day (QID) | ORAL | Status: DC | PRN

## 2012-01-04 MED ORDER — PENICILLIN G POTASSIUM 5000000 UNITS IJ SOLR
2.5000 10*6.[IU] | INTRAVENOUS | Status: DC
  Administered 2012-01-04 – 2012-01-05 (×4): 2.5 10*6.[IU] via INTRAVENOUS
  Filled 2012-01-04 (×8): qty 2.5

## 2012-01-04 MED ORDER — OXYTOCIN 20 UNITS IN LACTATED RINGERS INFUSION - SIMPLE: 125.0000 mL/h | Freq: Once | INTRAVENOUS | Status: DC

## 2012-01-04 MED ORDER — OXYCODONE-ACETAMINOPHEN 5-325 MG PO TABS: 1.0000 | ORAL_TABLET | ORAL | Status: DC | PRN

## 2012-01-04 MED ORDER — OXYTOCIN BOLUS FROM INFUSION
500.0000 mL | Freq: Once | INTRAVENOUS | Status: DC
  Filled 2012-01-04: qty 500
  Filled 2012-01-04: qty 1000

## 2012-01-04 MED ORDER — OXYTOCIN 20 UNITS IN LACTATED RINGERS INFUSION - SIMPLE: 1.0000 m[IU]/min | INTRAVENOUS | Status: DC
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
## 2012-01-04 NOTE — Progress Notes (Signed)
Patient ID: Laura Austin, female   DOB: 1991-04-11, 21 y.o.   MRN: [id number] .Subjective: Sitting up with family at [facility], denies need for pain meds,    Objective: BP 151/79  Pulse 95  Temp(Src) 98.7 F (37.1 C) (Oral)  Resp 18  Ht 5' (1.524 m)  Wt 122.925 kg (271 lb)  BMI 52.93 kg/m2   FHT:  FHR: 140 bpm, variability: moderate,  accelerations:  Present,  decelerations:  Absent UC:   irregular, every 5-6 minutes SVE:   Dilation: 2 Effacement (%): 70 Station: -2 Exam by:: Shelly Lilliard, CNM    Assessment / Plan: SROM GBS pos, rcv'd pcn at 1930 Will reevaluate at 2330 and start pitocin aug   Fetal Wellbeing:  Category I Pain Control:  n/a  Update physician PRN  Malissa Hippo 01/04/2012, 10:05 PM

## 2012-01-05 ENCOUNTER — Encounter (HOSPITAL_COMMUNITY)
Admission: AD | Disposition: A | Discharge status: Home / Self Care | Payer: Self-pay | Source: Ambulatory Visit | Attending: Obstetrics and Gynecology

## 2012-01-05 ENCOUNTER — Inpatient Hospital Stay (HOSPITAL_COMMUNITY): Payer: Medicaid Other | Admitting: Anesthesiology

## 2012-01-05 ENCOUNTER — Encounter (HOSPITAL_COMMUNITY): Payer: Self-pay | Admitting: Anesthesiology

## 2012-01-05 ENCOUNTER — Encounter (HOSPITAL_COMMUNITY): Payer: Self-pay | Admitting: *Deleted

## 2012-01-05 LAB — COMPREHENSIVE METABOLIC PANEL
BUN: 5 mg/dL — ABNORMAL LOW (ref 6–23)
CO2: 26 mEq/L (ref 19–32)
Chloride: 103 mEq/L (ref 96–112)
Creatinine, Ser: 0.57 mg/dL (ref 0.50–1.10)
GFR calc non Af Amer: >90 mL/min (ref >90)
Total Bilirubin: 0.5 mg/dL (ref 0.3–1.2)

## 2012-01-05 LAB — CBC
HCT: 31.3 % — ABNORMAL LOW (ref 36.0–46.0)
MCV: 88.2 fL (ref 78.0–100.0)
RBC: 3.55 MIL/uL — ABNORMAL LOW (ref 3.87–5.11)
RDW: 14.2 % (ref 11.5–15.5)
WBC: 8.4 10*3/uL (ref 4.0–10.5)

## 2012-01-05 LAB — URIC ACID: Uric Acid, Serum: 4.3 mg/dL (ref 2.4–7.0)

## 2012-01-05 LAB — LACTATE DEHYDROGENASE: LDH: 155 U/L (ref 94–250)

## 2012-01-05 SURGERY — Surgical Case
Anesthesia: Epidural | Site: Abdomen | Wound class: Clean Contaminated

## 2012-01-05 MED ORDER — KETOROLAC TROMETHAMINE 30 MG/ML IJ SOLN
30.0000 mg | Freq: Four times a day (QID) | INTRAMUSCULAR | Status: AC | PRN
  Administered 2012-01-05: 30 mg via INTRAVENOUS

## 2012-01-05 MED ORDER — PHENYLEPHRINE 40 MCG/ML (10ML) SYRINGE FOR IV PUSH (FOR BLOOD PRESSURE SUPPORT): 80.0000 ug | PREFILLED_SYRINGE | INTRAVENOUS | Status: DC | PRN

## 2012-01-05 MED ORDER — ZOLPIDEM TARTRATE 5 MG PO TABS: 5.0000 mg | ORAL_TABLET | Freq: Every evening | ORAL | Status: DC | PRN

## 2012-01-05 MED ORDER — KETOROLAC TROMETHAMINE 30 MG/ML IJ SOLN: 30.0000 mg | Freq: Four times a day (QID) | INTRAMUSCULAR | Status: AC | PRN

## 2012-01-05 MED ORDER — SIMETHICONE 80 MG PO CHEW
80.0000 mg | CHEWABLE_TABLET | Freq: Three times a day (TID) | ORAL | Status: DC
  Administered 2012-01-06 – 2012-01-10 (×15): 80 mg via ORAL

## 2012-01-05 MED ORDER — METOCLOPRAMIDE HCL 5 MG/ML IJ SOLN: 10.0000 mg | Freq: Three times a day (TID) | INTRAMUSCULAR | Status: DC | PRN

## 2012-01-05 MED ORDER — MORPHINE SULFATE (PF) 0.5 MG/ML IJ SOLN
INTRAMUSCULAR | Status: DC | PRN
  Administered 2012-01-05: 3 mg via EPIDURAL

## 2012-01-05 MED ORDER — NALBUPHINE SYRINGE 5 MG/0.5 ML
5.0000 mg | INJECTION | INTRAMUSCULAR | Status: DC | PRN
  Administered 2012-01-06: 5 mg via INTRAVENOUS
  Filled 2012-01-05 (×2): qty 1

## 2012-01-05 MED ORDER — FENTANYL CITRATE 0.05 MG/ML IJ SOLN: 25.0000 ug | INTRAMUSCULAR | Status: DC | PRN

## 2012-01-05 MED ORDER — ONDANSETRON HCL 4 MG/2ML IJ SOLN
INTRAMUSCULAR | Status: AC
  Filled 2012-01-05: qty 2

## 2012-01-05 MED ORDER — SCOPOLAMINE 1 MG/3DAYS TD PT72
MEDICATED_PATCH | TRANSDERMAL | Status: AC
  Filled 2012-01-05: qty 1

## 2012-01-05 MED ORDER — FERROUS SULFATE 325 (65 FE) MG PO TABS
325.0000 mg | ORAL_TABLET | Freq: Two times a day (BID) | ORAL | Status: DC
  Administered 2012-01-06 – 2012-01-10 (×9): 325 mg via ORAL
  Filled 2012-01-05 (×9): qty 1

## 2012-01-05 MED ORDER — LACTATED RINGERS IV SOLN
500.0000 mL | Freq: Once | INTRAVENOUS | Status: AC
  Administered 2012-01-05: 500 mL via INTRAVENOUS

## 2012-01-05 MED ORDER — BUPIVACAINE HCL (PF) 0.25 % IJ SOLN
INTRAMUSCULAR | Status: DC | PRN
  Administered 2012-01-05: 20 mL

## 2012-01-05 MED ORDER — FENTANYL 2.5 MCG/ML BUPIVACAINE 1/10 % EPIDURAL INFUSION (WH - ANES)
14.0000 mL/h | INTRAMUSCULAR | Status: DC
  Administered 2012-01-05 (×4): 14 mL/h via EPIDURAL
  Filled 2012-01-05 (×5): qty 60

## 2012-01-05 MED ORDER — DIPHENHYDRAMINE HCL 50 MG/ML IJ SOLN: 25.0000 mg | INTRAMUSCULAR | Status: DC | PRN

## 2012-01-05 MED ORDER — NALBUPHINE SYRINGE 5 MG/0.5 ML
5.0000 mg | INJECTION | INTRAMUSCULAR | Status: DC | PRN
  Filled 2012-01-05: qty 1

## 2012-01-05 MED ORDER — NALOXONE HCL 0.4 MG/ML IJ SOLN: 0.4000 mg | INTRAMUSCULAR | Status: DC | PRN

## 2012-01-05 MED ORDER — IBUPROFEN 600 MG PO TABS: 600.0000 mg | ORAL_TABLET | Freq: Four times a day (QID) | ORAL | Status: DC | PRN

## 2012-01-05 MED ORDER — WITCH HAZEL-GLYCERIN EX PADS: 1.0000 "application " | MEDICATED_PAD | CUTANEOUS | Status: DC | PRN

## 2012-01-05 MED ORDER — LANOLIN HYDROUS EX OINT: 1.0000 "application " | TOPICAL_OINTMENT | CUTANEOUS | Status: DC | PRN

## 2012-01-05 MED ORDER — ALBUTEROL SULFATE HFA 108 (90 BASE) MCG/ACT IN AERS
2.0000 | INHALATION_SPRAY | Freq: Four times a day (QID) | RESPIRATORY_TRACT | Status: DC | PRN
  Filled 2012-01-05: qty 6.7

## 2012-01-05 MED ORDER — SENNOSIDES-DOCUSATE SODIUM 8.6-50 MG PO TABS
2.0000 | ORAL_TABLET | Freq: Every day | ORAL | Status: DC
  Administered 2012-01-06 – 2012-01-09 (×4): 2 via ORAL

## 2012-01-05 MED ORDER — DIPHENHYDRAMINE HCL 25 MG PO CAPS
25.0000 mg | ORAL_CAPSULE | Freq: Four times a day (QID) | ORAL | Status: DC | PRN
  Administered 2012-01-06: 25 mg via ORAL
  Filled 2012-01-05: qty 1

## 2012-01-05 MED ORDER — PRENATAL MULTIVITAMIN CH
1.0000 | ORAL_TABLET | Freq: Every day | ORAL | Status: DC
  Administered 2012-01-06 – 2012-01-10 (×5): 1 via ORAL
  Filled 2012-01-05 (×4): qty 1

## 2012-01-05 MED ORDER — MORPHINE SULFATE (PF) 0.5 MG/ML IJ SOLN
INTRAMUSCULAR | Status: DC | PRN
  Administered 2012-01-05: 2 mg via INTRAVENOUS

## 2012-01-05 MED ORDER — OXYCODONE-ACETAMINOPHEN 5-325 MG PO TABS
1.0000 | ORAL_TABLET | ORAL | Status: DC | PRN
  Administered 2012-01-06: 1 via ORAL
  Administered 2012-01-07: 2 via ORAL
  Administered 2012-01-08: 1 via ORAL
  Administered 2012-01-08: 2 via ORAL
  Administered 2012-01-09 (×3): 1 via ORAL
  Administered 2012-01-10: 2 via ORAL
  Filled 2012-01-05 (×2): qty 1
  Filled 2012-01-05: qty 2
  Filled 2012-01-05 (×3): qty 1
  Filled 2012-01-05 (×2): qty 2

## 2012-01-05 MED ORDER — SIMETHICONE 80 MG PO CHEW
80.0000 mg | CHEWABLE_TABLET | ORAL | Status: DC | PRN
  Administered 2012-01-07: 80 mg via ORAL

## 2012-01-05 MED ORDER — METHYLERGONOVINE MALEATE 0.2 MG PO TABS: 0.2000 mg | ORAL_TABLET | ORAL | Status: DC | PRN

## 2012-01-05 MED ORDER — LIDOCAINE HCL (PF) 1 % IJ SOLN
INTRAMUSCULAR | Status: DC | PRN
  Administered 2012-01-05: 4 mL
  Administered 2012-01-05: 3 mL

## 2012-01-05 MED ORDER — EPHEDRINE 5 MG/ML INJ: 10.0000 mg | INTRAVENOUS | Status: DC | PRN

## 2012-01-05 MED ORDER — MEPERIDINE HCL 25 MG/ML IJ SOLN: 6.2500 mg | INTRAMUSCULAR | Status: DC | PRN

## 2012-01-05 MED ORDER — PRENATAL MULTIVITAMIN CH: 1.0000 | ORAL_TABLET | Freq: Every day | ORAL | Status: DC

## 2012-01-05 MED ORDER — OXYTOCIN 20 UNITS IN LACTATED RINGERS INFUSION - SIMPLE: 125.0000 mL/h | INTRAVENOUS | Status: AC

## 2012-01-05 MED ORDER — TETANUS-DIPHTH-ACELL PERTUSSIS 5-2.5-18.5 LF-MCG/0.5 IM SUSP
0.5000 mL | Freq: Once | INTRAMUSCULAR | Status: AC
  Administered 2012-01-06: 0.5 mL via INTRAMUSCULAR
  Filled 2012-01-05: qty 0.5

## 2012-01-05 MED ORDER — PHENYLEPHRINE 40 MCG/ML (10ML) SYRINGE FOR IV PUSH (FOR BLOOD PRESSURE SUPPORT)
80.0000 ug | PREFILLED_SYRINGE | INTRAVENOUS | Status: DC | PRN
  Filled 2012-01-05: qty 5

## 2012-01-05 MED ORDER — ONDANSETRON HCL 4 MG/2ML IJ SOLN: 4.0000 mg | Freq: Three times a day (TID) | INTRAMUSCULAR | Status: DC | PRN

## 2012-01-05 MED ORDER — IBUPROFEN 600 MG PO TABS
600.0000 mg | ORAL_TABLET | Freq: Four times a day (QID) | ORAL | Status: DC
  Administered 2012-01-06 – 2012-01-10 (×16): 600 mg via ORAL
  Filled 2012-01-05 (×17): qty 1

## 2012-01-05 MED ORDER — DIPHENHYDRAMINE HCL 25 MG PO CAPS: 25.0000 mg | ORAL_CAPSULE | ORAL | Status: DC | PRN

## 2012-01-05 MED ORDER — DIPHENHYDRAMINE HCL 50 MG/ML IJ SOLN: 12.5000 mg | INTRAMUSCULAR | Status: DC | PRN

## 2012-01-05 MED ORDER — KETOROLAC TROMETHAMINE 30 MG/ML IJ SOLN
INTRAMUSCULAR | Status: AC
  Filled 2012-01-05: qty 1

## 2012-01-05 MED ORDER — ONDANSETRON HCL 4 MG PO TABS: 4.0000 mg | ORAL_TABLET | ORAL | Status: DC | PRN

## 2012-01-05 MED ORDER — ONDANSETRON HCL 4 MG/2ML IJ SOLN: 4.0000 mg | INTRAMUSCULAR | Status: DC | PRN

## 2012-01-05 MED ORDER — METOCLOPRAMIDE HCL 5 MG/ML IJ SOLN
INTRAMUSCULAR | Status: DC | PRN
  Administered 2012-01-05: 10 mg via INTRAVENOUS

## 2012-01-05 MED ORDER — MEASLES, MUMPS & RUBELLA VAC ~~LOC~~ INJ
0.5000 mL | INJECTION | Freq: Once | SUBCUTANEOUS | Status: DC
  Filled 2012-01-05: qty 0.5

## 2012-01-05 MED ORDER — DIBUCAINE 1 % RE OINT: 1.0000 "application " | TOPICAL_OINTMENT | RECTAL | Status: DC | PRN

## 2012-01-05 MED ORDER — CEFAZOLIN SODIUM-DEXTROSE 2-3 GM-% IV SOLR
2.0000 g | Freq: Once | INTRAVENOUS | Status: DC
  Filled 2012-01-05: qty 50

## 2012-01-05 MED ORDER — ONDANSETRON HCL 4 MG/2ML IJ SOLN
INTRAMUSCULAR | Status: DC | PRN
  Administered 2012-01-05: 4 mg via INTRAVENOUS

## 2012-01-05 MED ORDER — OXYTOCIN 10 UNIT/ML IJ SOLN
INTRAMUSCULAR | Status: DC | PRN
  Administered 2012-01-05 (×2): 20 [IU] via INTRAMUSCULAR

## 2012-01-05 MED ORDER — MEPERIDINE HCL 25 MG/ML IJ SOLN
INTRAMUSCULAR | Status: DC | PRN
  Administered 2012-01-05: 25 mg via INTRAVENOUS

## 2012-01-05 MED ORDER — OXYTOCIN 20 UNITS IN LACTATED RINGERS INFUSION - SIMPLE
1.0000 m[IU]/min | INTRAVENOUS | Status: DC
  Administered 2012-01-05: 2 m[IU]/min via INTRAVENOUS

## 2012-01-05 MED ORDER — OXYTOCIN 10 UNIT/ML IJ SOLN
INTRAMUSCULAR | Status: AC
  Filled 2012-01-05: qty 4

## 2012-01-05 MED ORDER — MORPHINE SULFATE 0.5 MG/ML IJ SOLN
INTRAMUSCULAR | Status: AC
  Filled 2012-01-05: qty 10

## 2012-01-05 MED ORDER — SCOPOLAMINE 1 MG/3DAYS TD PT72
1.0000 | MEDICATED_PATCH | Freq: Once | TRANSDERMAL | Status: DC
  Administered 2012-01-05: 1.5 mg via TRANSDERMAL

## 2012-01-05 MED ORDER — DIPHENHYDRAMINE HCL 50 MG/ML IJ SOLN
12.5000 mg | INTRAMUSCULAR | Status: DC | PRN
  Administered 2012-01-05 (×2): 12.5 mg via INTRAVENOUS
  Filled 2012-01-05 (×2): qty 1

## 2012-01-05 MED ORDER — MENTHOL 3 MG MT LOZG: 1.0000 | LOZENGE | OROMUCOSAL | Status: DC | PRN

## 2012-01-05 MED ORDER — SODIUM CHLORIDE 0.9 % IJ SOLN: 3.0000 mL | INTRAMUSCULAR | Status: DC | PRN

## 2012-01-05 MED ORDER — LABETALOL HCL 5 MG/ML IV SOLN
10.0000 mg | INTRAVENOUS | Status: DC | PRN
  Filled 2012-01-05: qty 4

## 2012-01-05 MED ORDER — LACTATED RINGERS IV SOLN
INTRAVENOUS | Status: DC
  Administered 2012-01-05 (×7): via INTRAUTERINE

## 2012-01-05 MED ORDER — SODIUM BICARBONATE 8.4 % IV SOLN
INTRAVENOUS | Status: DC | PRN
  Administered 2012-01-05: 5 mL via EPIDURAL

## 2012-01-05 MED ORDER — MEPERIDINE HCL 25 MG/ML IJ SOLN
INTRAMUSCULAR | Status: AC
  Filled 2012-01-05: qty 1

## 2012-01-05 MED ORDER — BUPIVACAINE HCL (PF) 0.25 % IJ SOLN
INTRAMUSCULAR | Status: AC
  Filled 2012-01-05: qty 30

## 2012-01-05 MED ORDER — EPHEDRINE 5 MG/ML INJ
10.0000 mg | INTRAVENOUS | Status: DC | PRN
  Filled 2012-01-05: qty 4

## 2012-01-05 MED ORDER — METHYLERGONOVINE MALEATE 0.2 MG/ML IJ SOLN: 0.2000 mg | INTRAMUSCULAR | Status: DC | PRN

## 2012-01-05 MED ORDER — SODIUM CHLORIDE 0.9 % IV SOLN
1.0000 ug/kg/h | INTRAVENOUS | Status: DC | PRN
  Filled 2012-01-05: qty 2.5

## 2012-01-05 MED ORDER — FENTANYL 2.5 MCG/ML BUPIVACAINE 1/10 % EPIDURAL INFUSION (WH - ANES)
INTRAMUSCULAR | Status: DC | PRN
  Administered 2012-01-05: 12 mL/h via EPIDURAL

## 2012-01-05 SURGICAL SUPPLY — 38 items
BENZOIN TINCTURE PRP APPL 2/3 (GAUZE/BANDAGES/DRESSINGS) ×2 IMPLANT
BOOTIES KNEE HIGH SLOAN (MISCELLANEOUS) ×4 IMPLANT
CHLORAPREP W/TINT 26ML (MISCELLANEOUS) ×2 IMPLANT
CLOSURE STERI STRIP 1/2 X4 (GAUZE/BANDAGES/DRESSINGS) ×2 IMPLANT
CLOTH BEACON ORANGE TIMEOUT ST (SAFETY) ×2 IMPLANT
DRAIN JACKSON PRT FLT 10 (DRAIN) ×2 IMPLANT
DRESSING TELFA 8X3 (GAUZE/BANDAGES/DRESSINGS) ×4 IMPLANT
DRSG PAD ABDOMINAL 8X10 ST (GAUZE/BANDAGES/DRESSINGS) ×2 IMPLANT
ELECT REM PT RETURN 9FT ADLT (ELECTROSURGICAL) ×2
ELECTRODE REM PT RTRN 9FT ADLT (ELECTROSURGICAL) ×1 IMPLANT
EVACUATOR SILICONE 100CC (DRAIN) ×2 IMPLANT
EXTRACTOR VACUUM M CUP 4 TUBE (SUCTIONS) IMPLANT
GAUZE SPONGE 4X4 12PLY STRL LF (GAUZE/BANDAGES/DRESSINGS) ×4 IMPLANT
GLOVE BIOGEL PI IND STRL 7.0 (GLOVE) ×2 IMPLANT
GLOVE BIOGEL PI INDICATOR 7.0 (GLOVE) ×2
GLOVE ECLIPSE 6.5 STRL STRAW (GLOVE) ×2 IMPLANT
GOWN PREVENTION PLUS LG XLONG (DISPOSABLE) ×6 IMPLANT
KIT ABG SYR 3ML LUER SLIP (SYRINGE) IMPLANT
NEEDLE HYPO 22GX1.5 SAFETY (NEEDLE) ×2 IMPLANT
NEEDLE HYPO 25X5/8 SAFETYGLIDE (NEEDLE) IMPLANT
NS IRRIG 1000ML POUR BTL (IV SOLUTION) ×4 IMPLANT
PACK C SECTION WH (CUSTOM PROCEDURE TRAY) ×2 IMPLANT
PAD ABD 7.5X8 STRL (GAUZE/BANDAGES/DRESSINGS) ×2 IMPLANT
RTRCTR C-SECT PINK 25CM LRG (MISCELLANEOUS) IMPLANT
SLEEVE SCD COMPRESS KNEE MED (MISCELLANEOUS) IMPLANT
SPONGE GAUZE 4X4 12PLY (GAUZE/BANDAGES/DRESSINGS) ×2 IMPLANT
STRIP CLOSURE SKIN 1/2X4 (GAUZE/BANDAGES/DRESSINGS) ×2 IMPLANT
SUT CHROMIC GUT AB #0 18 (SUTURE) IMPLANT
SUT MNCRL AB 3-0 PS2 27 (SUTURE) ×2 IMPLANT
SUT SILK 2 0 FSL 18 (SUTURE) IMPLANT
SUT VIC AB 0 CTX 36 (SUTURE) ×3
SUT VIC AB 0 CTX36XBRD ANBCTRL (SUTURE) ×3 IMPLANT
SUT VIC AB 1 CT1 36 (SUTURE) ×4 IMPLANT
SYR 20CC LL (SYRINGE) ×2 IMPLANT
TAPE CLOTH SURG 4X10 WHT LF (GAUZE/BANDAGES/DRESSINGS) ×2 IMPLANT
TOWEL OR 17X24 6PK STRL BLUE (TOWEL DISPOSABLE) ×4 IMPLANT
TRAY FOLEY CATH 14FR (SET/KITS/TRAYS/PACK) ×2 IMPLANT
WATER STERILE IRR 1000ML POUR (IV SOLUTION) ×2 IMPLANT

## 2012-01-05 NOTE — Transfer of Care (Signed)
Immediate Anesthesia Transfer of Care Note  Patient: Laura Austin  Procedure(s) Performed: Procedure(s) (LRB): CESAREAN SECTION (N/A)  Patient Location: PACU  Anesthesia Type: Epidural  Level of Consciousness: awake, alert  and oriented  Airway & Oxygen Therapy: Patient Spontanous Breathing  Post-op Assessment: Report given to PACU RN and Post -op Vital signs reviewed and stable  Post vital signs: stable  Complications: No apparent anesthesia complications

## 2012-01-05 NOTE — Anesthesia Postprocedure Evaluation (Signed)
  Anesthesia Post-op Note  Patient: Laura Austin  Procedure(s) Performed: Procedure(s) (LRB): CESAREAN SECTION (N/A)  Patient Location: PACU  Anesthesia Type: Epidural  Level of Consciousness: awake, alert  and oriented  Airway and Oxygen Therapy: Patient Spontanous Breathing  Post-op Pain: none  Post-op Assessment: Post-op Vital signs reviewed, Patient's Cardiovascular Status Stable, Respiratory Function Stable, Patent Airway, No signs of Nausea or vomiting, Pain level controlled, No headache and No backache  Post-op Vital Signs: Reviewed and stable  Complications: No apparent anesthesia complications

## 2012-01-05 NOTE — Progress Notes (Addendum)
Laura Austin is a 21 y.o. G3P0020 at 86w1dadmitted for induction of labor due to Post dates. Due date 12/28/11.  Subjective:  Comfortable with  Epidural Contractions every 1-2 minutes, lasting 45-60 seconds, intensity: MVU 180-210 for the last 4 hours with 1 episode of 150    Objective: BP 144/89  Pulse 98  Temp(Src) 99.3 F (37.4 C) (Oral)  Resp 20  Ht 5' (1.524 m)  Wt 122.925 kg (271 lb)  BMI 52.93 kg/m2  SpO2 100% I/O last 3 completed shifts: In: -  Out: 250 [Urine:250]    FHT: reassuring with good scalp stimulation MVU: 200 SVE:   Dilation: 4 Effacement (%): 90 Station: 0 Exam by:: dr. Estanislado Pandy  Labs: Lab Results  Component Value Date   WBC 8.4 01/05/2012   HGB 10.1* 01/05/2012   HCT 31.3* 01/05/2012   MCV 88.2 01/05/2012   PLT 142* 01/05/2012    Assessment / Plan: Arrest in active phase of labor Fetal Wellbeing: reassuring Reviewed findings with patient. Recommend a cesarean section. Procedure reviewed as well as R&B. Patient to decide with family.  Ebrahim Deremer A 01/05/2012, 6:33 PM   Patient has agreed to proceed with C/S. Questions answered. OR notified.

## 2012-01-05 NOTE — Anesthesia Preprocedure Evaluation (Addendum)
Anesthesia Evaluation  Patient identified by MRN, date of birth, ID band Patient awake    Reviewed: Allergy & Precautions, H&P , Patient's Chart, lab work & pertinent test results  Airway Mallampati: III TM Distance: >3 FB Neck ROM: Full    Dental No notable dental hx. (+) Teeth Intact   Pulmonary asthma ,  breath sounds clear to auscultation  Pulmonary exam normal       Cardiovascular Rhythm:Regular Rate:Normal     Neuro/Psych Depression negative neurological ROS     GI/Hepatic negative GI ROS, Neg liver ROS,   Endo/Other  Morbid obesity  Renal/GU negative Renal ROS  negative genitourinary   Musculoskeletal negative musculoskeletal ROS (+)   Abdominal (+) + obese,   Peds  Hematology negative hematology ROS (+)   Anesthesia Other Findings   Reproductive/Obstetrics (+) Pregnancy                           Anesthesia Physical Anesthesia Plan  ASA: III and Emergent  Anesthesia Plan: Epidural   Post-op Pain Management:    Induction:   Airway Management Planned:   Additional Equipment:   Intra-op Plan:   Post-operative Plan:   Informed Consent: I have reviewed the patients History and Physical, chart, labs and discussed the procedure including the risks, benefits and alternatives for the proposed anesthesia with the patient or authorized representative who has indicated his/her understanding and acceptance.   Dental advisory given  Plan Discussed with: Anesthesiologist  Anesthesia Plan Comments:        Anesthesia Quick Evaluation

## 2012-01-05 NOTE — Progress Notes (Signed)
Comfortable with epidural, some pressure O 150/82        fhts 130 LTV min to mod occasional variable      uc q 2 180 MVU at times      Vag 4 100 -1 VTX R A early labor P continue care Lavera Guise, CNM

## 2012-01-05 NOTE — Consult Note (Signed)
Requested to attend primary C/S for arrested labor at post dates @ 40.2 weeks. Mother is GBBS positive and on treatment and has history of decreased fetal movement ROM for ~ 24 hours.    At birth infant in vertex and was manually extracted with spontaneous cries and active tone. Placed under radiant warmer and given vigorous tactile stimulation with drying. Bulb suction of naso/oropharynx yielding minimal fluid. No dysmorphic features.  Apgar 9/9 at 1 and 5 minutes.  Stooled while being cared for under radiant warmer.   Shown to mother and MGM and care deferred to attendant L/D RN and assigned pediatrician.    Dagoberto Ligas MD [facility]

## 2012-01-05 NOTE — Op Note (Signed)
Preoperative diagnosis: Intrauterine pregnancy at 41 weeks and 1 days with failure to progress  Post operative diagnosis: Same  Anesthesia: Epidural  Anesthesiologist: Dr. Mal Amabile  Procedure: Primary low transverse cesarean section  Surgeon: Dr. Dois Davenport Andrell Bergeson  Assistant: Sanda Klein CNM  Estimated blood loss: 800 cc  Procedure:  After being informed of the planned procedure and possible complications including bleeding, infection, injury to other organs, informed consent is obtained. The patient is taken to OR #2 and her previously placed Epidural anesthesia was reinforced. She is placed in the dorsal decubitus position with the pelvis tilted to the left. She is then prepped and draped in a sterile fashion. A Foley catheter is  in her bladder.  After assessing adequate level of anesthesia, we infiltrate the suprapubic area with 20 cc of Marcaine 0.25 and perform a Pfannenstiel incision which is brought down sharply to the fascia. The fascia is entered in a low transverse fashion. Linea alba is dissected. Peritoneum is entered in a midline fashion. An Alexis retractor is easily positioned. Visceral peritoneum is entered in a low transverse fashion allowing [place] to safely retract bladder by developing a bladder flap.  The myometrium is then entered in a low transverse fashion; first with knife and then extended bluntly. Amniotic fluid is clear. We assist the birth of a female  infant in vertex, Occiput Posterior with posterior asynclitism. Mouth and nose are suctioned. The baby is delivered. The cord is clamped and sectioned. The baby is given to the neonatologist present in the room.  10 cc of blood is drawn from the umbilical vein.The placenta is allowed to deliver spontaneously. It is complete and the cord has 3 vessels. Uterine revision is negative.  We proceed with closure of the myometrium in 2 layers: First with a running locked suture of 0 Vicryl, then with a Lembert suture  of 0 Vicryl imbricating the first one. Hemostasis is completed with cauterization on peritoneal edges and 2 figure-of-eight stitches of 0 Vicryl.  Both paracolic gutters are cleaned. Both tubes and ovaries are assessed and normal. The pelvis is profusely irrigated with warm saline to confirm a satisfactory hemostasis.  Retractors and sponges are removed. Under fascia hemostasis is completed with cauterization. The fascia is then closed with 2 running sutures of 0 Vicryl meeting midline. The wound is irrigated with warm saline and hemostasis is completed with cauterization.A #10 JP drain is placed in the wound via a left incision and sutured to the skin with 0 Silk. The skin is closed with a subcuticular suture of 3-0 Monocryl and Steri-Strips.  Instrument and sponge count is complete x2. Estimated blood loss is 800 cc.  The procedure is well tolerated by the patient who is taken to recovery room in a well and stable condition.  female baby named Maia Petties was born at 20:08 and received an Apgar of 9  at 1 minute and 9 at 5 minutes.    Specimen: Placenta sent to L & D   Deosha Werden A MD 3/8/20139:03 PM

## 2012-01-05 NOTE — Addendum Note (Signed)
Addendum  created 01/05/12 2208 by Tyrone Apple. Malen Gauze, MD   Modules edited:Charting, Inpatient Notes

## 2012-01-05 NOTE — Anesthesia Procedure Notes (Signed)
Epidural Patient location during procedure: OB Start time: 01/05/2012 2:17 AM  Staffing Anesthesiologist: Felesha Moncrieffe A. Performed by: anesthesiologist   Preanesthetic Checklist Completed: patient identified, site marked, surgical consent, pre-op evaluation, timeout performed, IV checked, risks and benefits discussed and monitors and equipment checked  Epidural Patient position: sitting Prep: site prepped and draped and DuraPrep Patient monitoring: continuous pulse ox and blood pressure Approach: midline Injection technique: LOR air  Needle:  Needle type: Tuohy  Needle gauge: 17 G Needle length: 9 cm Needle insertion depth: 9 cm Catheter type: closed end flexible Catheter size: 19 Gauge Catheter at skin depth: 15 cm Test dose: negative and Other  Assessment Events: blood not aspirated, injection not painful, no injection resistance, negative IV test and no paresthesia  Additional Notes Patient identified. Risks and benefits discussed including failed block, incomplete  Pain control, post dural puncture headache, nerve damage, paralysis, blood pressure Changes, nausea, vomiting, reactions to medications-both toxic and allergic and post Partum back pain. All questions were answered. Patient expressed understanding and wished to proceed. Sterile technique was used throughout procedure. Epidural site was Dressed with sterile barrier dressing. No paresthesias, signs of intravascular injection Or signs of intrathecal spread were encountered.  Patient was more comfortable after the epidural was dosed. Please see RN's note for documentation of vital signs and FHR which are stable.

## 2012-01-05 NOTE — Addendum Note (Signed)
Addendum  created 01/05/12 2208 by Jaymie Mckiddy A. Dearis Danis, MD   Modules edited:Charting, Inpatient Notes    

## 2012-01-05 NOTE — Progress Notes (Signed)
Patient ID: Ileana Ladd, female   DOB: February 12, 1991, 21 y.o.   MRN: [id number] .Subjective: Remains comfortable with epidural, rcv'd benedryl x1 for itching   Objective: BP 151/85  Pulse 94  Temp(Src) 98.4 F (36.9 C) (Oral)  Resp 18  Ht 5' (1.524 m)  Wt 122.925 kg (271 lb)  BMI 52.93 kg/m2  SpO2 95%   FHT:  FHR: 140 bpm, variability: moderate,  accelerations:  Present,  decelerations:  Present variables, ? early decels UC:   2-3 SVE:   Dilation: 2.5 Effacement (%): 70 Station: -2 Exam by:: S. Blyss Lugar, cnm  VE now =3/100/-2 Pitocin off FHR prolonged variable after internals placed, returned to BL 140 after position change, 02 and pitocin off  Assessment / Plan: Induction of labor due to gestational hypertension,  progressing well on pitocin, still in early labor GBS pos IUPC and FSE placed without difficulty,   BP's remain elevated, will draw PIH labs and repeat CBC now  Begin 24 hour urine collection  CTO closely  Fetal Wellbeing:  Category II Pain Control:  Epidural  Dr Estanislado Pandy updated  Malissa Hippo 01/05/2012, 6:34 AM

## 2012-01-05 NOTE — Progress Notes (Signed)
Comfortable with epidural fhts130 LTV min to mod q 3-5 25-35 mmHg  MVU 120 Vag 3 90 -2 VTX A inadequate uc P resume IV pitocin Lavera Guise, CNM

## 2012-01-05 NOTE — Progress Notes (Addendum)
Patient ID: Laura Austin, female   DOB: Apr 27, 1991, 21 y.o.   MRN: [id number] .Subjective: Comfortable now with epidural   Objective: BP 151/85  Pulse 94  Temp(Src) 98.4 F (36.9 C) (Oral)  Resp 18  Ht 5' (1.524 m)  Wt 122.925 kg (271 lb)  BMI 52.93 kg/m2  SpO2 95%  Filed Vitals:   01/05/12 0402 01/05/12 0432 01/05/12 0502 01/05/12 0532  BP: 146/92 145/77 157/73 151/85  Pulse: 100 91 89 94  Temp:  98.4 F (36.9 C)    TempSrc:  Oral    Resp:      Height:      Weight:      SpO2:         FHT:  FHR: 120 bpm, variability: moderate,  accelerations:  Present,  decelerations:  Absent UC:   regular, every 2-3 minutes SVE:   Dilation: 2.5 Effacement (%): 70 Station: -2 Exam by:: S. Delrose Rohwer, cnm  Pitocin at 3mu  Assessment / Plan: Induction of labor due to gestational hypertension,  progressing well on pitocin SROM at 1800 on 3-7 GBS pos, tx'd with PCN Cervix is now thinning out, per RN exam was 1cm and thick prior to epidural Will continue pitocin augmentation BP labile, now on forearm with big cuff, will CTO  Fetal Wellbeing:  Category I Pain Control:  Epidural  Update physician PRN  Malissa Hippo 01/05/2012, 6:29 AM

## 2012-01-05 NOTE — Brief Op Note (Signed)
01/05/2012  9:02 PM  PATIENT:  Laura Austin  21 y.o. female  PRE-OPERATIVE DIAGNOSIS:  Failure to Progress IUP at 41+1 weeks  POST-OPERATIVE DIAGNOSIS:  Failure to Progress    Procedure(s): CESAREAN SECTION    Surgeon(s): Esmeralda Arthur, MD   ASSISTANTS: Sanda Klein CNM   ANESTHESIA:   epidural  LOCAL MEDICATIONS USED:  MARCAINE     SPECIMEN:  placenta  DISPOSITION OF SPECIMEN:  PATHOLOGY  COUNTS:  YES  ESTIMATED BLOOD LOSS: 800 cc  PATIENT DISPOSITION:  PACU - hemodynamically stable.       [facility] AMD 01/05/2012 9:02 PM

## 2012-01-06 DIAGNOSIS — O09299 Supervision of pregnancy with other poor reproductive or obstetric history, unspecified trimester: Secondary | ICD-10-CM

## 2012-01-06 LAB — COMPREHENSIVE METABOLIC PANEL
Alkaline Phosphatase: 81 U/L (ref 39–117)
BUN: 5 mg/dL — ABNORMAL LOW (ref 6–23)
Creatinine, Ser: 0.74 mg/dL (ref 0.50–1.10)
GFR calc Af Amer: >90 mL/min (ref >90)
Glucose, Bld: 93 mg/dL (ref 70–99)
Potassium: 3.9 mEq/L (ref 3.5–5.1)
Total Bilirubin: 0.6 mg/dL (ref 0.3–1.2)
Total Protein: 4.9 g/dL — ABNORMAL LOW (ref 6.0–8.3)

## 2012-01-06 LAB — URIC ACID: Uric Acid, Serum: 5 mg/dL (ref 2.4–7.0)

## 2012-01-06 LAB — CBC
MCH: 28.8 pg (ref 26.0–34.0)
Platelets: 177 10*3/uL (ref 150–400)
RBC: 3.61 MIL/uL — ABNORMAL LOW (ref 3.87–5.11)
RDW: 14.2 % (ref 11.5–15.5)

## 2012-01-06 LAB — PROTEIN, URINE, 24 HOUR
Protein, 24H Urine: 1150 mg/d — ABNORMAL HIGH (ref 50–100)
Urine Total Volume-UPROT: 2675 mL

## 2012-01-06 LAB — CREATININE CLEARANCE, URINE, 24 HOUR
Collection Interval-CRCL: 24 hours
Creatinine: 0.74 mg/dL (ref 0.50–1.10)
Urine Total Volume-CRCL: 2675 mL

## 2012-01-06 MED ORDER — MAGNESIUM SULFATE 40 MG/ML IJ SOLN
2.0000 g | Freq: Once | INTRAMUSCULAR | Status: DC
Start: 2012-01-06 — End: 2012-01-06

## 2012-01-06 MED ORDER — LACTATED RINGERS IV SOLN
INTRAVENOUS | Status: DC
  Administered 2012-01-06 – 2012-01-07 (×4): via INTRAVENOUS
  Administered 2012-01-07: 100 mL/h via INTRAVENOUS
  Administered 2012-01-08: 02:00:00 via INTRAVENOUS

## 2012-01-06 MED ORDER — MAGNESIUM SULFATE BOLUS VIA INFUSION
2.0000 g | Freq: Once | INTRAVENOUS | Status: AC
  Administered 2012-01-06: 2 g via INTRAVENOUS
  Filled 2012-01-06: qty 500

## 2012-01-06 MED ORDER — MAGNESIUM SULFATE 40 G IN LACTATED RINGERS - SIMPLE
2.0000 g/h | INTRAVENOUS | Status: DC
  Administered 2012-01-06 – 2012-01-07 (×2): 2 g/h via INTRAVENOUS
  Filled 2012-01-06 (×2): qty 500

## 2012-01-06 MED ORDER — MAGNESIUM SULFATE 40 G IN LACTATED RINGERS - SIMPLE
2.0000 g/h | INTRAVENOUS | Status: DC
Start: 2012-01-06 — End: 2012-01-06
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
## 2012-01-06 NOTE — Anesthesia Postprocedure Evaluation (Signed)
  Anesthesia Post-op Note  Patient: Laura Austin  Procedure(s) Performed: Procedure(s) (LRB): CESAREAN SECTION (N/A)  Patient Location: PACU and Mother/Baby  Anesthesia Type: Epidural  Level of Consciousness: awake, alert  and oriented  Airway and Oxygen Therapy: Patient Spontanous Breathing  Post-op Pain: none  Post-op Assessment: Post-op Vital signs reviewed and Patient's Cardiovascular Status Stable  Post-op Vital Signs: Reviewed and stable  Complications: No apparent anesthesia complications

## 2012-01-06 NOTE — Progress Notes (Addendum)
S comfortable with pain meds, just now passing gas, no N,V able to eat O Alert, cooperative, lungs clear bilaterally, ap RRR, abd soft, gravid, nt, bowel sounds hypoactive,  abd dressing dry sm amount serous drainage in JP, foley drains clear to pink, sm rubra flow, +1 edema lower legs, -Homan's sign bilaterally Results for orders placed during the hospital encounter of 01/04/12 (from the past 72 hour(s))  CBC     Status: Abnormal   Collection Time   01/04/12  6:25 PM      Component Value Range Comment   WBC 7.9  4.0 - 10.5 (K/uL)    RBC 3.77 (*) 3.87 - 5.11 (MIL/uL)    Hemoglobin 10.7 (*) 12.0 - 15.0 (g/dL)    HCT 16.1 (*) 09.6 - 46.0 (%)    MCV 88.3  78.0 - 100.0 (fL)    MCH 28.4  26.0 - 34.0 (pg)    MCHC 32.1  30.0 - 36.0 (g/dL)    RDW 04.5  40.9 - 81.1 (%)    Platelets 166  150 - 400 (K/uL)   RPR     Status: Normal   Collection Time   01/04/12  6:25 PM      Component Value Range Comment   RPR NON REACTIVE  NON REACTIVE    CBC     Status: Abnormal   Collection Time   01/05/12  6:29 AM      Component Value Range Comment   WBC 8.4  4.0 - 10.5 (K/uL)    RBC 3.55 (*) 3.87 - 5.11 (MIL/uL)    Hemoglobin 10.1 (*) 12.0 - 15.0 (g/dL)    HCT 91.4 (*) 78.2 - 46.0 (%)    MCV 88.2  78.0 - 100.0 (fL)    MCH 28.5  26.0 - 34.0 (pg)    MCHC 32.3  30.0 - 36.0 (g/dL)    RDW 95.6  21.3 - 08.6 (%)    Platelets 142 (*) 150 - 400 (K/uL)   COMPREHENSIVE METABOLIC PANEL     Status: Abnormal   Collection Time   01/05/12  6:29 AM      Component Value Range Comment   Sodium 136  135 - 145 (mEq/L)    Potassium 3.7  3.5 - 5.1 (mEq/L)    Chloride 103  96 - 112 (mEq/L)    CO2 26  19 - 32 (mEq/L)    Glucose, Bld 77  70 - 99 (mg/dL)    BUN 5 (*) 6 - 23 (mg/dL)    Creatinine, Ser 5.78  0.50 - 1.10 (mg/dL)    Calcium 7.9 (*) 8.4 - 10.5 (mg/dL)    Total Protein 5.3 (*) 6.0 - 8.3 (g/dL)    Albumin 2.3 (*) 3.5 - 5.2 (g/dL)    AST 17  0 - 37 (U/L)    ALT 13  0 - 35 (U/L)    Alkaline Phosphatase 83  39 - 117  (U/L)    Total Bilirubin 0.5  0.3 - 1.2 (mg/dL)    GFR calc non Af Amer >90  >90 (mL/min)    GFR calc Af Amer >90  >90 (mL/min)   LACTATE DEHYDROGENASE     Status: Normal   Collection Time   01/05/12  6:29 AM      Component Value Range Comment   LD 155  94 - 250 (U/L)   URIC ACID     Status: Normal   Collection Time   01/05/12  6:29 AM  Component Value Range Comment   Uric Acid, Serum 4.3  2.4 - 7.0 (mg/dL)   CREATININE CLEARANCE, URINE, 24 HOUR     Status: Abnormal   Collection Time   01/05/12  6:30 AM      Component Value Range Comment   Urine Total Volume-CRCL 2675      Collection Interval-CRCL 24      Creatinine, Urine 62.24      Creatinine 0.74  0.50 - 1.10 (mg/dL)    Creatinine, 16X Ur 1665  700 - 1800 (mg/day)    Creatinine Clearance 156 (*) 75 - 115 (mL/min)   COMPREHENSIVE METABOLIC PANEL     Status: Abnormal   Collection Time   01/06/12  5:28 AM      Component Value Range Comment   Sodium 134 (*) 135 - 145 (mEq/L)    Potassium 3.9  3.5 - 5.1 (mEq/L)    Chloride 100  96 - 112 (mEq/L)    CO2 26  19 - 32 (mEq/L)    Glucose, Bld 93  70 - 99 (mg/dL)    BUN 5 (*) 6 - 23 (mg/dL)    Creatinine, Ser 0.96  0.50 - 1.10 (mg/dL)    Calcium 8.2 (*) 8.4 - 10.5 (mg/dL)    Total Protein 4.9 (*) 6.0 - 8.3 (g/dL)    Albumin 2.0 (*) 3.5 - 5.2 (g/dL)    AST 17  0 - 37 (U/L)    ALT 11  0 - 35 (U/L)    Alkaline Phosphatase 81  39 - 117 (U/L)    Total Bilirubin 0.6  0.3 - 1.2 (mg/dL)    GFR calc non Af Amer >90  >90 (mL/min)    GFR calc Af Amer >90  >90 (mL/min)   URIC ACID     Status: Normal   Collection Time   01/06/12  5:28 AM      Component Value Range Comment   Uric Acid, Serum 5.0  2.4 - 7.0 (mg/dL)   CBC     Status: Abnormal   Collection Time   01/06/12  5:28 AM      Component Value Range Comment   WBC 13.6 (*) 4.0 - 10.5 (K/uL)    RBC 3.61 (*) 3.87 - 5.11 (MIL/uL)    Hemoglobin 10.4 (*) 12.0 - 15.0 (g/dL)    HCT 04.5 (*) 40.9 - 46.0 (%)    MCV 88.1  78.0 - 100.0 (fL)     MCH 28.8  26.0 - 34.0 (pg)    MCHC 32.7  30.0 - 36.0 (g/dL)    RDW 81.1  91.4 - 78.2 (%)    Platelets 177  150 - 400 (K/uL)    A PO cesarean section day one PIH 24 hour urine pending P continue care P discussed ambulation, foley out, dressing off with shower

## 2012-01-06 NOTE — Progress Notes (Signed)
Discusse 24 hour urine protein 1150 and additional labs with Dr. Estanislado Pandy and plan for Magnesiium, discussed risks of pre eclampsia with pt an benefits of Magnesium and she concurs with plan of care. See orders by Dr. Estanislado Pandy. Lavera Guise, CNM

## 2012-01-06 NOTE — Progress Notes (Signed)
Contacted CNM regarding 24 hour urine. No orders received at this time.  Boykin Peek, RN

## 2012-01-06 NOTE — Addendum Note (Signed)
Addendum  created 01/06/12 9562 by Orlie Pollen, CRNA   Modules edited:Charges VN, Notes Section

## 2012-01-07 MED ORDER — FUROSEMIDE 10 MG/ML IJ SOLN
20.0000 mg | Freq: Once | INTRAMUSCULAR | Status: AC
  Administered 2012-01-07: 20 mg via INTRAVENOUS
  Filled 2012-01-07: qty 2

## 2012-01-07 NOTE — Progress Notes (Signed)
Subjective: Postpartum Day  2 Cesarean Delivery Patient reports incisional pain, tolerating PO and + flatus, wants foley out, using motrin but did take some percocet this afternoon for incisional pain, breastfeeding, undecided regarding birth control.    Objective: Vital signs in last 24 hours: Temp:  [98.3 F (36.8 C)-98.8 F (37.1 C)] 98.5 F (36.9 C) (03/10 1200) Pulse Rate:  [86-106] 100  (03/10 1307) Resp:  [16-20] 20  (03/10 1307) BP: (112-151)/(45-92) 128/54 mmHg (03/10 1307) SpO2:  [95 %-100 %] 97 % (03/10 1307) Weight:  [272 lb 11.2 oz (123.696 kg)-277 lb 3.2 oz (125.737 kg)] 277 lb 3.2 oz (125.737 kg) (03/10 0625)  Intake/Output Summary (Last 24 hours) at 01/07/12 1423 Last data filed at 01/07/12 1400  Gross per 24 hour  Intake 4897.5 ml  Output   2010 ml  Net 2887.5 ml  BP 128/54  Pulse 100  Temp(Src) 98.5 F (36.9 C) (Oral)  Resp 20  Ht 5' (1.524 m)  Wt 277 lb 3.2 oz (125.737 kg)  BMI 54.14 kg/m2  SpO2 97%  Breastfeeding? Unknown Physical Exam:  General: alert, cooperative and no distress Lochia: appropriate Uterine Fundus: firm small serosa flow Incision: healing well, no dehiscence, no significant erythema, JP with serous drainage, DVT Evaluation: Negative Homan's sign. 1+ nonpitting edema lower legs Lungs clear bilaterally, AP RRR, abd softly distended, bowel sounds active  Basename 01/06/12 0528 01/05/12 0629  HGB 10.4* 10.1*  HCT 31.8* 31.3*    Assessment/Plan: Status post Cesarean section. Lactating Pre eclampsia  Doing well postoperatively.  Continue current care, on Magnesium only 2000, reviewed with Dr. Estanislado Pandy will give lasix.Marland Kitchen  KREBSBACH, MARY 01/07/2012, 2:21 PM

## 2012-01-08 ENCOUNTER — Encounter (HOSPITAL_COMMUNITY): Payer: Self-pay | Admitting: Obstetrics and Gynecology

## 2012-01-08 ENCOUNTER — Inpatient Hospital Stay (HOSPITAL_COMMUNITY): Payer: Medicaid Other

## 2012-01-08 DIAGNOSIS — E876 Hypokalemia: Secondary | ICD-10-CM

## 2012-01-08 DIAGNOSIS — IMO0002 Reserved for concepts with insufficient information to code with codable children: Secondary | ICD-10-CM

## 2012-01-08 LAB — CBC
HCT: 28.5 % — ABNORMAL LOW (ref 36.0–46.0)
MCH: 28.3 pg (ref 26.0–34.0)
MCHC: 31.9 g/dL (ref 30.0–36.0)
MCV: 88.5 fL (ref 78.0–100.0)
Platelets: 163 10*3/uL (ref 150–400)
RDW: 14.5 % (ref 11.5–15.5)

## 2012-01-08 LAB — COMPREHENSIVE METABOLIC PANEL
AST: 15 U/L (ref 0–37)
Albumin: 2 g/dL — ABNORMAL LOW (ref 3.5–5.2)
BUN: 5 mg/dL — ABNORMAL LOW (ref 6–23)
Calcium: 7.3 mg/dL — ABNORMAL LOW (ref 8.4–10.5)
Chloride: 102 mEq/L (ref 96–112)
Creatinine, Ser: 0.59 mg/dL (ref 0.50–1.10)
Total Bilirubin: 0.3 mg/dL (ref 0.3–1.2)

## 2012-01-08 MED ORDER — POTASSIUM CHLORIDE CRYS ER 20 MEQ PO TBCR
40.0000 meq | EXTENDED_RELEASE_TABLET | Freq: Every day | ORAL | Status: AC
  Administered 2012-01-08 – 2012-01-09 (×2): 40 meq via ORAL
  Filled 2012-01-08 (×2): qty 2

## 2012-01-08 MED ORDER — FUROSEMIDE 10 MG/ML IJ SOLN
10.0000 mg | Freq: Once | INTRAMUSCULAR | Status: AC
  Administered 2012-01-08: 10 mg via INTRAVENOUS
  Filled 2012-01-08: qty 2

## 2012-01-08 MED ORDER — NIFEDIPINE ER 30 MG PO TB24
30.0000 mg | ORAL_TABLET | Freq: Every day | ORAL | Status: DC
  Administered 2012-01-08 – 2012-01-10 (×3): 30 mg via ORAL
  Filled 2012-01-08 (×3): qty 1

## 2012-01-08 MED ORDER — HYDROCHLOROTHIAZIDE 50 MG PO TABS
50.0000 mg | ORAL_TABLET | Freq: Every day | ORAL | Status: DC
  Administered 2012-01-08 – 2012-01-10 (×3): 50 mg via ORAL
  Filled 2012-01-08 (×3): qty 2

## 2012-01-08 NOTE — Progress Notes (Signed)
UR chart review completed.  

## 2012-01-08 NOTE — Progress Notes (Addendum)
Post Partum/Post Op Day 3  Subjective: Called by RN earlier in the day secondary to pt with SOB and crackles.  Cxray ordered.  Verbal report by RN - Cxray revealed pulmonary edemal.  Lasix ordered.  When I saw pt she had started diuresing.  Mg was d/c'd and pt was feeling much better.  No other compaints.  Tolerating po and ambulated without difficulty prior to episode of SOB.  Objective: Blood pressure 151/84, pulse 101, temperature 98.1 F (36.7 C), temperature source Oral, resp. rate 18, height 5' (1.524 m), weight 128.413 kg (283 lb 1.6 oz), SpO2 98.00%.  Physical Exam:  General: alert and no distress Lochia: appropriate Uterine Fundus: firm Incision: dressing c/d/i and JP drain in place. DVT Evaluation: No evidence of DVT seen on physical exam. Negative Homan's sign. No cords or calf tenderness. 1+ edema   Basename 01/08/12 0410 01/06/12 0528  HGB 9.1* 10.4*  HCT 28.5* 31.8*    Assessment/Plan: PPD3 s/p Mg secondary to preeclampsia with pulmonary edema.  Will continue to observe.  Will start HCTZ and Procardia XL 30mg  secondary to persistently elevated BPs.  Otherwise pt stable and will continue routine postop care.   LOS: 4 days   Shawonda Kerce Y 01/08/2012, 8:46 PM

## 2012-01-09 DIAGNOSIS — IMO0002 Reserved for concepts with insufficient information to code with codable children: Secondary | ICD-10-CM

## 2012-01-09 DIAGNOSIS — J810 Acute pulmonary edema: Secondary | ICD-10-CM

## 2012-01-09 LAB — BASIC METABOLIC PANEL
CO2: 29 mEq/L (ref 19–32)
Calcium: 8.2 mg/dL — ABNORMAL LOW (ref 8.4–10.5)
Sodium: 139 mEq/L (ref 135–145)

## 2012-01-09 NOTE — Progress Notes (Signed)
Subjective: Postpartum Day 4: Cesarean Delivery Patient reports tolerating PO, + flatus, + BM and no problems voiding.  No SOB or chest pain   Objective: Vital signs in last 24 hours: Temp:  [97.8 F (36.6 C)-98.3 F (36.8 C)] 98.3 F (36.8 C) (03/12 1158) Pulse Rate:  [86-111] 107  (03/12 1158) Resp:  [18-20] 20  (03/12 1158) BP: (138-161)/(57-97) 148/93 mmHg (03/12 1158) SpO2:  [95 %-99 %] 95 % (03/12 1158) Weight:  [121.428 kg (267 lb 11.2 oz)] 121.428 kg (267 lb 11.2 oz) (03/12 0541)  Physical Exam:  General: alert Lochia: appropriate Uterine Fundus: firm Incision: healing well, no significant drainage, no dehiscence DVT Evaluation: No evidence of DVT seen on physical exam.   Basename 01/08/12 0410  HGB 9.1*  HCT 28.5*    Assessment/Plan: Status post Cesarean section. Doing well postoperatively.  Continue current care. Pt had pulmonary edema.  Now stable 16pound weight loss in two days Continue HCTZ Transfer to the floor  Consider discharge in the morning  Shizuye Rupert A 01/09/2012, 1:01 PM

## 2012-01-09 NOTE — Progress Notes (Signed)
Subjective: Postpartum Day 3: Cesarean Delivery due to FTP Patient reports stinging pain at JP site.  Has now moved to [facility] 136.    Objective: Vital signs in last 24 hours: Temp:  [97.8 F (36.6 C)-98.3 F (36.8 C)] 98.3 F (36.8 C) (03/12 1300) Pulse Rate:  [86-132] 104  (03/12 1325) Resp:  [18-20] 20  (03/12 1300) BP: (138-157)/(57-97) 139/78 mmHg (03/12 1300) SpO2:  [94 %-99 %] 94 % (03/12 1300) Weight:  [121.428 kg (267 lb 11.2 oz)] 121.428 kg (267 lb 11.2 oz) (03/12 0541)  Physical Exam:  JP drain in place, with 12.5 cc overnight.  Small amount serosanguinous drainage currently in drain. Site WNL, NT C/S incision CDI    Assessment/Plan: Consulted with Dr. Dillard--OK'd removal of JP drain. JP drain removed without difficulty. Percocet now Will continue to observe status.  Nigel Bridgeman 01/09/2012, 3:52 PM

## 2012-01-10 MED ORDER — IBUPROFEN 600 MG PO TABS: 600.0000 mg | ORAL_TABLET | Freq: Four times a day (QID) | ORAL | Status: AC | PRN

## 2012-01-10 MED ORDER — NIFEDIPINE ER 30 MG PO TB24: 30.0000 mg | ORAL_TABLET | Freq: Every day | ORAL | Status: DC

## 2012-01-10 MED ORDER — HYDROCHLOROTHIAZIDE 50 MG PO TABS: ORAL_TABLET | ORAL | Status: DC

## 2012-01-10 MED ORDER — FERROUS SULFATE 325 (65 FE) MG PO TABS: 325.0000 mg | ORAL_TABLET | Freq: Two times a day (BID) | ORAL | Status: AC

## 2012-01-10 MED ORDER — OXYCODONE-ACETAMINOPHEN 5-325 MG PO TABS: 1.0000 | ORAL_TABLET | ORAL | Status: AC | PRN

## 2012-01-10 NOTE — Discharge Instructions (Signed)

## 2012-01-10 NOTE — Progress Notes (Signed)
Patient ID: Laura Austin, female   DOB: [birth year], 21 y.o.   MRN: [id number] Subjective: Postpartum Day 5: Cesarean Delivery Patient reports tolerating PO, + flatus and no problems voiding.   no complaints, up ad lib without syncope Pain well controlled with po meds BF well Mood stable, bonding well Ready for D/C   Objective: Vital signs in last 24 hours: Temp:  [97.9 F (36.6 C)-98.6 F (37 C)] 98.6 F (37 C) (03/13 0531) Pulse Rate:  [89-132] 89  (03/13 0531) Resp:  [18-20] 18  (03/13 0531) BP: (139-158)/(76-94) 144/76 mmHg (03/13 0531) SpO2:  [94 %-97 %] 97 % (03/12 2146) Weight:  [117.845 kg (259 lb 12.8 oz)] 117.845 kg (259 lb 12.8 oz) (03/13 0600)  wgt down an additional 5lbs,  Output -1402  Physical Exam:  General: alert and no distress Breasts:  Heart: RRR Lungs: CTAB Abdomen: BS x4 Uterine Fundus: firm Incision: CDI - 3 steri strips intact on r side,  Lochia: appropriate DVT Evaluation: No evidence of DVT seen on physical exam. Negative Homan's sign. Calf/Ankle edema is present. Mild 2+ Neg clonus   Basename 01/08/12 0410  HGB 9.1*  HCT 28.5*    Assessment/Plan: Status post Cesarean section. Doing well postoperatively.  Consider D/C Preeclampsia BP stable on procardia Pulmonary edema - resolved Diuresing well  Lactating  Will discuss with Dr Estanislado Pandy and consider D/C .  Laura Austin M 01/10/2012, 9:55 AM

## 2012-01-12 NOTE — Discharge Summary (Signed)
Obstetric Discharge Summary Reason for Admission: rupture of membranes Prenatal Procedures: NST and ultrasound Intrapartum Procedures: cesarean: low cervical, transverse, GBS prophylaxis and epidural Postpartum Procedures: Mag sulfate, CXR,  Complications-Operative and Postpartum: preeclampsia, pulmonary edema    Hemoglobin  Date Value Range Status  01/08/2012 9.1* 12.0-15.0 (g/dL) Final     HCT  Date Value Range Status  01/08/2012 28.5* 36.0-46.0 (%) Final    Hospital Course:  Hospital Course: Admitted for SROM without labor. pos GBS. Pt was augmented with pitocin without change in cx past 3-4cm despite adequate ctx pattern. BP"s were initially labile, with PIH labs normal. A 24hr urine was started after epidural placed. Because of  FTP in labor a LTCS was recommended. Delivery was performed by Dr. Estanislado Pandy without difficulty. Patient and baby tolerated the procedure without difficulty.  Infant to FTN. 24hr urine was noted to have 1150mg  or protein, pt was then started on mag sulfate infusion. On POD2, pt then had pulmonary edema and was given lasix and mag sulfate dc'd. She diuresed well and was tx'd to [facility], without further complications. She was started on procardia XL, 30mg  PO and BP remained stable.   breast feeding going well.on POD5,  Mom's physical exam was WNL, and she was discharged home in stable condition. Contraception plan was undecided .  She received adequate benefit from po pain medications.  Discharge Diagnoses: Term Pregnancy-delivered and Preelampsia, anemia   Discharge Information: Date: 01/12/2012 Activity: pelvic rest Diet: routine, iron rich foods Medications:  Medication List  As of 01/12/2012  4:20 PM   START taking these medications         ferrous sulfate 325 (65 FE) MG tablet   Take 1 tablet (325 mg total) by mouth 2 (two) times daily with a meal.      hydrochlorothiazide 50 MG tablet   Commonly known as: HYDRODIURIL   Take 1 tablet by mouth x 5 days        ibuprofen 600 MG tablet   Commonly known as: ADVIL,MOTRIN   Take 1 tablet (600 mg total) by mouth every 6 (six) hours as needed for pain.      NIFEdipine 30 MG 24 hr tablet   Commonly known as: PROCARDIA-XL/ADALAT CC   Take 1 tablet (30 mg total) by mouth daily.      oxyCODONE-acetaminophen 5-325 MG per tablet   Commonly known as: PERCOCET   Take 1-2 tablets by mouth every 4 (four) hours as needed (moderate - severe pain).         CONTINUE taking these medications         albuterol 108 (90 BASE) MCG/ACT inhaler   Commonly known as: PROVENTIL HFA;VENTOLIN HFA      prenatal multivitamin Tabs          Where to get your medications    These are the prescriptions that you need to pick up.   You may get these medications from any pharmacy.         ferrous sulfate 325 (65 FE) MG tablet   hydrochlorothiazide 50 MG tablet   ibuprofen 600 MG tablet   NIFEdipine 30 MG 24 hr tablet   oxyCODONE-acetaminophen 5-325 MG per tablet           Condition: stable Instructions: refer to practice specific booklet, smart start nurse to check BP in 2 days Discharge to: home Follow-up Information    Follow up with [facility] in 1 week. (for BP check)  Newborn Data: Live born  Information for the patient's newborn:  Laura Austin [[id number]]  female ; APGAR ,9, 9 ; weight ; 7#5oz Home with mother.  Jodene Polyak M 01/12/2012, 4:20 PM

## 2012-01-16 NOTE — Discharge Summary (Signed)
Discharge summary  Date of admission: 12/29/2011  Date of discharge: 12/30/2011  Admission diagnosis:  41 weeks and 1 day gestation  Decreased fetal movements  Nonreassuring fetal heart rate tracing  Postdates pregnancy  Obesity  Noncompliance  Discharge diagnoses:  41 weeks and 2 day gestation  Reassuring fetal status  Postdates pregnancy  Obesity  Noncompliance  Procedures this admission:  Nonstress test  Biophysical profile  History of present illness:  The patient is a 21 year old female, gravida 3 para 1-0-to-1, who presents at 41 weeks and 1 day gestation. The patient complains of decreased fetal movements. The patient has been followed at the [facility] [place] [facility]. Please see her dictated history and physical exam for details. A nonstress test in the office was reassuring but not reactive. A biophysical profile in the office was read as 6/8.  Admission physical exam:  The patient cervix was not favorable.  Hospital course:  On the date of admission the patient was observed in the hospital. Her fetal heart rate tracing had accelerations. Some decelerations were noted. The day following her admission she had a repeat ultrasound done in a biophysical profile was read as 8 out of 8. On this particular day the nonstress test was reactive and accelerations were reassuring. The patient was discharged to home. Plans were made for delivery later in the pregnancy when the cervix is more favorable.  Discharge instructions:  The patient will followup in the office. She will call for questions or concerns. She will call if there is again evidence of decreased fetal movements.  Mylinda Latina.D.

## 2012-01-31 ENCOUNTER — Emergency Department (HOSPITAL_COMMUNITY): Payer: Medicaid Other

## 2012-01-31 ENCOUNTER — Inpatient Hospital Stay (HOSPITAL_COMMUNITY)
Admission: EM | Admit: 2012-01-31 | Discharge: 2012-02-03 | DRG: 776 | Disposition: A | Discharge status: Home / Self Care | Payer: Medicaid Other | Attending: Neurology | Admitting: Neurology

## 2012-01-31 ENCOUNTER — Encounter (HOSPITAL_COMMUNITY): Payer: Self-pay | Admitting: Emergency Medicine

## 2012-01-31 DIAGNOSIS — R51 Headache: Secondary | ICD-10-CM

## 2012-01-31 DIAGNOSIS — O165 Unspecified maternal hypertension, complicating the puerperium: Secondary | ICD-10-CM | POA: Diagnosis present

## 2012-01-31 DIAGNOSIS — H547 Unspecified visual loss: Secondary | ICD-10-CM

## 2012-01-31 DIAGNOSIS — F449 Dissociative and conversion disorder, unspecified: Secondary | ICD-10-CM

## 2012-01-31 DIAGNOSIS — H543 Unqualified visual loss, both eyes: Secondary | ICD-10-CM | POA: Diagnosis present

## 2012-01-31 DIAGNOSIS — N390 Urinary tract infection, site not specified: Secondary | ICD-10-CM | POA: Diagnosis present

## 2012-01-31 DIAGNOSIS — A498 Other bacterial infections of unspecified site: Secondary | ICD-10-CM | POA: Diagnosis present

## 2012-01-31 DIAGNOSIS — O99345 Other mental disorders complicating the puerperium: Principal | ICD-10-CM | POA: Diagnosis present

## 2012-01-31 DIAGNOSIS — J45909 Unspecified asthma, uncomplicated: Secondary | ICD-10-CM | POA: Diagnosis present

## 2012-01-31 DIAGNOSIS — F4310 Post-traumatic stress disorder, unspecified: Secondary | ICD-10-CM

## 2012-01-31 LAB — CSF CELL COUNT WITH DIFFERENTIAL: WBC, CSF: 1 /mm3 (ref 0–5)

## 2012-01-31 LAB — COMPREHENSIVE METABOLIC PANEL
ALT: 10 U/L (ref 0–35)
AST: 24 U/L (ref 0–37)
Albumin: 2.9 g/dL — ABNORMAL LOW (ref 3.5–5.2)
Alkaline Phosphatase: 68 U/L (ref 39–117)
Potassium: 3.7 mEq/L (ref 3.5–5.1)
Sodium: 138 mEq/L (ref 135–145)
Total Protein: 6.5 g/dL (ref 6.0–8.3)

## 2012-01-31 LAB — CREATININE, SERUM: GFR calc Af Amer: >90 mL/min (ref >90)

## 2012-01-31 LAB — CBC
HCT: 32.3 % — ABNORMAL LOW (ref 36.0–46.0)
MCH: 28.2 pg (ref 26.0–34.0)
MCH: 28.3 pg (ref 26.0–34.0)
MCHC: 32.5 g/dL (ref 30.0–36.0)
MCV: 84.8 fL (ref 78.0–100.0)
Platelets: 227 10*3/uL (ref 150–400)
Platelets: 215 10*3/uL (ref 150–400)
RBC: 3.9 MIL/uL (ref 3.87–5.11)
RBC: 3.81 MIL/uL — ABNORMAL LOW (ref 3.87–5.11)
WBC: 10.7 10*3/uL — ABNORMAL HIGH (ref 4.0–10.5)

## 2012-01-31 LAB — PROTIME-INR
INR: 1.08 (ref 0.00–1.49)
Prothrombin Time: 14.2 seconds (ref 11.6–15.2)

## 2012-01-31 LAB — URINALYSIS, ROUTINE W REFLEX MICROSCOPIC
Nitrite: POSITIVE — AB
Protein, ur: NEGATIVE mg/dL
Specific Gravity, Urine: 1.025 (ref 1.005–1.030)
Urobilinogen, UA: 0.2 mg/dL (ref 0.0–1.0)

## 2012-01-31 LAB — DIFFERENTIAL
Basophils Relative: 0 % (ref 0–1)
Eosinophils Absolute: 0.1 10*3/uL (ref 0.0–0.7)
Lymphs Abs: 0.7 10*3/uL (ref 0.7–4.0)
Neutrophils Relative %: 87 % — ABNORMAL HIGH (ref 43–77)

## 2012-01-31 LAB — URINE MICROSCOPIC-ADD ON

## 2012-01-31 LAB — GRAM STAIN: Special Requests: NORMAL

## 2012-01-31 LAB — POCT I-STAT, CHEM 8
BUN: 7 mg/dL (ref 6–23)
Chloride: 107 mEq/L (ref 96–112)
Sodium: 141 mEq/L (ref 135–145)

## 2012-01-31 LAB — TROPONIN I: Troponin I: <0.3 ng/mL (ref <0.30)

## 2012-01-31 LAB — PROTEIN AND GLUCOSE, CSF: Total  Protein, CSF: 19 mg/dL (ref 15–45)

## 2012-01-31 MED ORDER — ACETAMINOPHEN 325 MG PO TABS: 650.0000 mg | ORAL_TABLET | Freq: Four times a day (QID) | ORAL | Status: DC | PRN

## 2012-01-31 MED ORDER — ACETAMINOPHEN 325 MG PO TABS
650.0000 mg | ORAL_TABLET | Freq: Once | ORAL | Status: AC
  Administered 2012-01-31: 650 mg via ORAL
  Filled 2012-01-31: qty 2

## 2012-01-31 MED ORDER — ASPIRIN EC 325 MG PO TBEC
325.0000 mg | DELAYED_RELEASE_TABLET | Freq: Every day | ORAL | Status: DC
  Administered 2012-01-31 – 2012-02-03 (×4): 325 mg via ORAL
  Filled 2012-01-31 (×5): qty 1

## 2012-01-31 MED ORDER — ENOXAPARIN SODIUM 40 MG/0.4ML ~~LOC~~ SOLN
40.0000 mg | SUBCUTANEOUS | Status: DC
Start: 2012-01-31 — End: 2012-02-03
  Administered 2012-01-31 – 2012-02-02 (×3): 40 mg via SUBCUTANEOUS
  Filled 2012-01-31 (×4): qty 0.4

## 2012-01-31 MED ORDER — SODIUM CHLORIDE 0.9 % IV SOLN: 250.0000 mL | INTRAVENOUS | Status: DC | PRN

## 2012-01-31 MED ORDER — DOCUSATE SODIUM 100 MG PO CAPS
100.0000 mg | ORAL_CAPSULE | Freq: Two times a day (BID) | ORAL | Status: DC
  Administered 2012-01-31 – 2012-02-02 (×6): 100 mg via ORAL
  Filled 2012-01-31 (×6): qty 1

## 2012-01-31 MED ORDER — SODIUM CHLORIDE 0.9 % IJ SOLN
3.0000 mL | Freq: Two times a day (BID) | INTRAMUSCULAR | Status: DC
  Administered 2012-01-31: 10 mL via INTRAVENOUS
  Administered 2012-02-01 – 2012-02-02 (×4): 3 mL via INTRAVENOUS

## 2012-01-31 MED ORDER — ASPIRIN 81 MG PO CHEW
324.0000 mg | CHEWABLE_TABLET | Freq: Once | ORAL | Status: AC
  Administered 2012-01-31: 324 mg via ORAL
  Filled 2012-01-31: qty 1
  Filled 2012-01-31: qty 4

## 2012-01-31 MED ORDER — SODIUM CHLORIDE 0.9 % IJ SOLN: 3.0000 mL | INTRAMUSCULAR | Status: DC | PRN

## 2012-01-31 MED ORDER — HYDROCODONE-ACETAMINOPHEN 5-325 MG PO TABS
1.0000 | ORAL_TABLET | ORAL | Status: DC | PRN
  Administered 2012-01-31 – 2012-02-02 (×7): 2 via ORAL
  Administered 2012-02-02: 1 via ORAL
  Administered 2012-02-02 (×2): 2 via ORAL
  Administered 2012-02-03: 1 via ORAL
  Filled 2012-01-31: qty 1
  Filled 2012-01-31 (×9): qty 2
  Filled 2012-01-31: qty 1

## 2012-01-31 MED ORDER — ACETAMINOPHEN 650 MG RE SUPP: 650.0000 mg | Freq: Four times a day (QID) | RECTAL | Status: DC | PRN

## 2012-01-31 NOTE — ED Notes (Signed)
Code Stroke times: Encoded      0654 Called          0655 Pt Arrival      0706 EDP exam   0707 Stroke team 0659 Last seen normal  0600 Arrival CT     0709 Plebotomist  0715 CT read        0715
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
## 2012-01-31 NOTE — ED Notes (Signed)
Pt went to MRI with this RN

## 2012-01-31 NOTE — ED Notes (Signed)
Patient transported to X-ray 
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
## 2012-01-31 NOTE — Procedures (Signed)
Prior to beginning the procedure, the procedure was discussed at length with the patient, who voice understanding of the procedure and the risks, benefits and alternatives.  The patient gave both verbal and written consent to proceed. A time-out procedure was performed.  Subsequently, fluoroscopy was utilized to identify the L3-L4 interspace, and a small mark was placed on the patient's skin.  The patient was then prepped and draped in the usual sterile fashion, and a small amount of 1% lidocaine was infiltrated subcutaneously to provide local anesthesia.  Subsequently, under intermittent fluoroscopic guidance a 15.2-cm long 20 gauge spinal needle was introduced percutaneously into the L3-L4 interspace, and CSF was obtained.  Opening pressure was measured at 16.5 cm of water.  A total of 32 ml of clear CSF was acquired in four successive specimen tubes which were sent to the laboratory for testing analysis per orders of the primary medical team.  The needle was removed from the patient's back, and a small Band-Aid was placed on the patient's skin at the entry site.

## 2012-02-01 DIAGNOSIS — F4310 Post-traumatic stress disorder, unspecified: Secondary | ICD-10-CM | POA: Diagnosis present

## 2012-02-01 DIAGNOSIS — F449 Dissociative and conversion disorder, unspecified: Secondary | ICD-10-CM

## 2012-02-01 LAB — CBC
Hemoglobin: 10 g/dL — ABNORMAL LOW (ref 12.0–15.0)
MCH: 27.7 pg (ref 26.0–34.0)
MCV: 86.4 fL (ref 78.0–100.0)
Platelets: 197 10*3/uL (ref 150–400)
RBC: 3.61 MIL/uL — ABNORMAL LOW (ref 3.87–5.11)
WBC: 6 10*3/uL (ref 4.0–10.5)

## 2012-02-01 LAB — URINE CULTURE

## 2012-02-01 LAB — BASIC METABOLIC PANEL
CO2: 26 mEq/L (ref 19–32)
Calcium: 8.3 mg/dL — ABNORMAL LOW (ref 8.4–10.5)
Glucose, Bld: 95 mg/dL (ref 70–99)
Potassium: 3.6 mEq/L (ref 3.5–5.1)
Sodium: 139 mEq/L (ref 135–145)

## 2012-02-01 MED ORDER — SULFAMETHOXAZOLE-TMP DS 800-160 MG PO TABS
1.0000 | ORAL_TABLET | Freq: Two times a day (BID) | ORAL | Status: DC
  Administered 2012-02-01 – 2012-02-03 (×5): 1 via ORAL
  Filled 2012-02-01 (×6): qty 1

## 2012-02-01 NOTE — Progress Notes (Signed)
Nursing staff and Nursing techs have picked up on some small details leading [place] to believe that Laura Austin can see.  While taking VS, pt lifedt her hand and placed it directly in the pulse ox machine without any difficulty.  Pt also turns her arm band around on her wrist where we can scan her barcode for meds.  Will continue to monitor.

## 2012-02-01 NOTE — Progress Notes (Signed)
Utilization review completed. Eisha Chatterjee, RN, BSN. 02/01/12  

## 2012-02-01 NOTE — Progress Notes (Signed)
Subjective: No change in complete blindness involving entire visual fields. Still having intermittent headaches.  Objective: Current vital signs: BP 97/62  Pulse 91  Temp(Src) 98.6 F (37 C) (Oral)  Resp 18  Ht 5\' 5"  (1.651 m)  SpO2 95% Vital signs in last 24 hours: Temp:  [97.8 F (36.6 C)-98.9 F (37.2 C)] 98.6 F (37 C) (04/04 0540) Pulse Rate:  [91-92] 91  (04/04 0540) Resp:  [18-20] 18  (04/04 0540) BP: (93-119)/(57-77) 97/62 mmHg (04/04 0540) SpO2:  [93 %-97 %] 95 % (04/04 0540)  Neurologic Exam: Patient is alert and oriented well to time and place. Affect is flat. Patient does not focus visually and direction of those addressing her. She also does not visually track except occasionally when she is distracted. No clear response to visual threat.  Lab Results:  Recent Results (from the past 240 hour(s))  URINE CULTURE     Status: Normal (Preliminary result)   Collection Time   01/31/12  9:15 AM      Component Value Range Status Comment   Specimen Description URINE, RANDOM   Final    Special Requests NONE   Final    Culture  Setup Time [id number]   Final    Colony Count >=100,000 COLONIES/ML   Final    Culture ESCHERICHIA COLI   Final    Report Status PENDING   Incomplete   GRAM STAIN     Status: Normal   Collection Time   01/31/12 11:45 AM      Component Value Range Status Comment   Specimen Description CSF   Final    Special Requests Normal   Final    Gram Stain     Final    Value: CYTOSPIN PREP     WBC PRESENT,BOTH PMN AND MONONUCLEAR     NO ORGANISMS SEEN   Report Status 01/31/2012 FINAL   Final   CSF CULTURE     Status: Normal (Preliminary result)   Collection Time   01/31/12 11:45 AM      Component Value Range Status Comment   Specimen Description CSF   Final    Special Requests Normal   Final    Gram Stain     Final    Value: CYTOSPIN WBC PRESENT,BOTH PMN AND MONONUCLEAR     NO ORGANISMS SEEN     Performed at [facility]   Culture PENDING    Incomplete    Report Status PENDING   Incomplete    Medications:  Scheduled:   . aspirin EC  325 mg Oral Daily  . docusate sodium  100 mg Oral BID  . enoxaparin  40 mg Subcutaneous Q24H  . sodium chloride  3 mL Intravenous Q12H  . sulfamethoxazole-trimethoprim  1 tablet Oral Q12H    Assessment/Plan: 1. 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 Patient has no evidence of an acute central nervous system abnormality affecting visual pathways nor affecting occipital lobes of the brain. She has no clinical indications of posterior cerebrovascular circulation abnormality. Was also no indication of a demyelinating disorder. 2. No signs of CNS infection. CSF was normal. 3. Urinary tract infection with positive urine culture for Escherichia coli. This is the likely source for her low-grade fever.  Plan: 1. Bactrim DS twice a day for 7 days. 2. Psychiatry consult for likely hysterical blindness.  C.R. Roseanne Reno, MD [facility] [phone]  02/01/2012  8:57 AM
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
## 2012-02-02 NOTE — Progress Notes (Signed)
Pt showed signs t/o the day that she had no vision problems.  When asked if she needed a drink, she states she had a Ginger Ale and pointed to it on the window sill and t/o the day she reached over to the table to answer the phone without any difficulty finding it or placing it back on the base.  Pt will pick up her hand and place her finger directly in the pulse ox on the dynamap when taking vitals with no direction.  Pt still refuses to make eye contact with me; but if I am cleaning up her room she will trace/follow me.

## 2012-02-03 LAB — CSF CULTURE W GRAM STAIN: Special Requests: NORMAL

## 2012-02-03 MED ORDER — HYDROCHLOROTHIAZIDE 25 MG PO TABS
25.0000 mg | ORAL_TABLET | Freq: Every day | ORAL | Status: DC
  Filled 2012-02-03: qty 1

## 2012-02-03 MED ORDER — HYDROCHLOROTHIAZIDE 25 MG PO TABS: 25.0000 mg | ORAL_TABLET | Freq: Every day | ORAL | Status: DC

## 2012-02-03 MED ORDER — NIFEDIPINE ER 30 MG PO TB24
30.0000 mg | ORAL_TABLET | Freq: Every day | ORAL | Status: DC
  Administered 2012-02-03: 30 mg via ORAL
  Filled 2012-02-03: qty 1

## 2012-02-03 MED ORDER — PRENATAL MULTIVITAMIN CH
1.0000 | ORAL_TABLET | Freq: Every day | ORAL | Status: DC
  Administered 2012-02-03: 1 via ORAL
  Filled 2012-02-03: qty 1

## 2012-02-03 MED ORDER — FERROUS SULFATE 325 (65 FE) MG PO TABS
325.0000 mg | ORAL_TABLET | Freq: Two times a day (BID) | ORAL | Status: DC
  Filled 2012-02-03 (×2): qty 1

## 2012-02-03 MED ORDER — ALBUTEROL SULFATE HFA 108 (90 BASE) MCG/ACT IN AERS: 2.0000 | INHALATION_SPRAY | Freq: Four times a day (QID) | RESPIRATORY_TRACT | Status: DC | PRN

## 2012-02-03 MED ORDER — SULFAMETHOXAZOLE-TMP DS 800-160 MG PO TABS: 1.0000 | ORAL_TABLET | Freq: Two times a day (BID) | ORAL | Status: DC

## 2012-02-03 MED ORDER — NIFEDIPINE ER 30 MG PO TB24: 30.0000 mg | ORAL_TABLET | Freq: Every day | ORAL | Status: DC
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
## 2012-02-03 NOTE — Discharge Instructions (Signed)
STROKE/TIA DISCHARGE INSTRUCTIONS SMOKING Cigarette smoking nearly doubles your risk of having a stroke & is the single most alterable risk factor  If you smoke or have smoked in the last 12 months, you are advised to quit smoking for your health.  Most of the excess cardiovascular risk related to smoking disappears within a year of stopping.  Ask you doctor about anti-smoking medications  [place] Quit Line: 1-800-QUIT NOW  Free Smoking Cessation Classes ([phone]  CHOLESTEROL Know your levels; limit fat & cholesterol in your diet  Lipid Panel  No results found for this basename: chol, trig, hdl, cholhdl, vldl, ldlcalc      Many patients benefit from treatment even if their cholesterol is at goal.  Goal: Total Cholesterol (CHOL) less than 160  Goal:  Triglycerides (TRIG) less than 150  Goal:  HDL greater than 40  Goal:  LDL (LDLCALC) less than 100   BLOOD PRESSURE American Stroke Association blood pressure target is less that 120/80 mm/Hg  Your discharge blood pressure is:  BP: 97/62 mmHg  Monitor your blood pressure  Limit your salt and alcohol intake  Many individuals will require more than one medication for high blood pressure  DIABETES (A1c is a blood sugar average for last 3 months) Goal HGBA1c is under 7% (HBGA1c is blood sugar average for last 3 months)  Diabetes: {STROKE DC DIABETES:22357}    No results found for this basename: HGBA1C     Your HGBA1c can be lowered with medications, healthy diet, and exercise.  Check your blood sugar as directed by your physician  Call your physician if you experience unexplained or low blood sugars.  PHYSICAL ACTIVITY/REHABILITATION Goal is 30 minutes at least 4 days per week    {STROKE DC ACTIVITY/REHAB:22359}  Activity decreases your risk of heart attack and stroke and makes your heart stronger.  It helps control your weight and blood pressure; helps you relax and can improve your mood.  Participate in a regular exercise  program.  Talk with your doctor about the best form of exercise for you (dancing, walking, swimming, cycling).  DIET/WEIGHT Goal is to maintain a healthy weight  Your discharge diet is: General *** liquids Your height is:  Height: 5\' 5"  (165.1 cm) Your current weight is:   Your Body Mass Index (BMI) is:     Following the type of diet specifically designed for you will help prevent another stroke.  Your goal weight range is:  ***  Your goal Body Mass Index (BMI) is 19-24.  Healthy food habits can help reduce 3 risk factors for stroke:  High cholesterol, hypertension, and excess weight.  RESOURCES Stroke/Support Group:  Call [phone]  they meet the 3rd Sunday of the month on the Rehab Unit at [facility], [place] ( no meetings June, July & Aug).  STROKE EDUCATION PROVIDED/REVIEWED AND GIVEN TO PATIENT Stroke warning signs and symptoms How to activate emergency medical system (call 911). Medications prescribed at discharge. Need for follow-up after discharge. Personal risk factors for stroke. Pneumonia vaccine given:   {STROKE DC YES/NO/DATE:22363} Flu vaccine given:   {STROKE DC YES/NO/DATE:22363} My questions have been answered, the writing is legible, and I understand these instructions.  I will adhere to these goals & educational materials that have been provided to me after my discharge from the hospital.

## 2012-02-05 LAB — OLIGOCLONAL BANDS, CSF + SERM

## 2012-02-08 ENCOUNTER — Encounter (HOSPITAL_COMMUNITY): Payer: Self-pay | Admitting: *Deleted

## 2012-02-08 ENCOUNTER — Telehealth: Payer: Self-pay | Admitting: Obstetrics and Gynecology

## 2012-02-08 ENCOUNTER — Other Ambulatory Visit: Payer: Self-pay | Admitting: Obstetrics and Gynecology

## 2012-02-08 ENCOUNTER — Inpatient Hospital Stay (HOSPITAL_COMMUNITY)
Admission: AD | Admit: 2012-02-08 | Discharge: 2012-02-08 | Disposition: A | Discharge status: Home / Self Care | Payer: Medicaid Other | Source: Ambulatory Visit | Attending: Obstetrics and Gynecology | Admitting: Obstetrics and Gynecology

## 2012-02-08 DIAGNOSIS — N920 Excessive and frequent menstruation with regular cycle: Secondary | ICD-10-CM

## 2012-02-08 HISTORY — DX: Urinary tract infection, site not specified: N39.0

## 2012-02-08 HISTORY — DX: Reserved for concepts with insufficient information to code with codable children: IMO0002

## 2012-02-08 HISTORY — DX: Personal history of Methicillin resistant Staphylococcus aureus infection: Z86.14

## 2012-02-08 HISTORY — DX: Chlamydial infection, unspecified: A74.9

## 2012-02-08 LAB — COMPREHENSIVE METABOLIC PANEL
Alkaline Phosphatase: 68 U/L (ref 39–117)
BUN: 9 mg/dL (ref 6–23)
Chloride: 103 mEq/L (ref 96–112)
Creatinine, Ser: 0.76 mg/dL (ref 0.50–1.10)
GFR calc Af Amer: >90 mL/min (ref >90)
Glucose, Bld: 83 mg/dL (ref 70–99)
Potassium: 4 mEq/L (ref 3.5–5.1)
Total Bilirubin: 0.3 mg/dL (ref 0.3–1.2)

## 2012-02-08 LAB — CBC
HCT: 32.2 % — ABNORMAL LOW (ref 36.0–46.0)
Hemoglobin: 10.1 g/dL — ABNORMAL LOW (ref 12.0–15.0)
MCHC: 31.4 g/dL (ref 30.0–36.0)
MCV: 86.1 fL (ref 78.0–100.0)
RDW: 13.8 % (ref 11.5–15.5)
WBC: 7.9 10*3/uL (ref 4.0–10.5)

## 2012-02-08 LAB — TYPE AND SCREEN

## 2012-02-08 LAB — DIFFERENTIAL
Eosinophils Relative: 2 % (ref 0–5)
Lymphocytes Relative: 39 % (ref 12–46)
Lymphs Abs: 3 10*3/uL (ref 0.7–4.0)
Monocytes Absolute: 0.7 10*3/uL (ref 0.1–1.0)

## 2012-02-08 LAB — URIC ACID: Uric Acid, Serum: 5.9 mg/dL (ref 2.4–7.0)

## 2012-02-08 MED ORDER — LACTATED RINGERS IV BOLUS (SEPSIS)
500.0000 mL | Freq: Once | INTRAVENOUS | Status: AC
  Administered 2012-02-08: 1000 mL via INTRAVENOUS

## 2012-02-08 MED ORDER — LACTATED RINGERS IV SOLN
INTRAVENOUS | Status: DC
  Administered 2012-02-08: 16:00:00 via INTRAVENOUS

## 2012-02-08 NOTE — Progress Notes (Signed)
Sw met with pt in MAU to provide resources for psychiatric follow up.  Sw offered to schedule an appointment of pt however she declined.  Sw provided pt with contact information for the [facility], [facility] [place], [employer] and [facility].  Sw provided pt with a cab voucher and was appreciative of resources.

## 2012-02-08 NOTE — MAU Note (Signed)
Pt states bleeding began last Wednesday, c/s 01/05/2012, states after c/s she did not bleed like this. Has been wearing diapers. Pt noted a lot of clots, dark blood. Denies pain at present.

## 2012-02-08 NOTE — MAU Note (Signed)
Pt assisted to front door in wc, Taxi arrived to take pt home.  Pt's called a family member to notify she was leaving now.

## 2012-02-08 NOTE — MAU Note (Signed)
Laura Austin at bedside discussing plan

## 2012-02-08 NOTE — MAU Note (Addendum)
C/s  On 03/08- primary c/s- posterior presentation, pre-eclamptic- dc'd 03/12.  Was at [facility]: lost visions, signs of stroke, fever, HA, hypertension/ MRI, CT and spinal tap were negative- was in from Wed to Sat .  BP was stabilized in ER, though still has not regained vision- reports complete darkness. States this bleeding is nothing like after delivery.  Bled for 2 wks after triage.   Bleeding started on Wed 04/03- thought it was period, has been heavy has passed some clots.  Denies pain.  Reports numbness in lower abd.
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
## 2012-02-08 NOTE — Telephone Encounter (Signed)
Spoke with pt when she called. S/P  C/S 01/05/12.   States had normal bleeding until 01/31/12 when began bleeding heavily.   Was  Changing pad q hr.  Now is using diapers and changing q hr.  No pain.  Feeling fatigue and lightheadedness.  Consult w/VL.  Pt to MAU.  Advised pt not to drive.  States will arrange transportation.

## 2012-02-15 ENCOUNTER — Ambulatory Visit: Payer: Medicaid Other | Admitting: Obstetrics and Gynecology

## 2012-02-27 ENCOUNTER — Ambulatory Visit (INDEPENDENT_AMBULATORY_CARE_PROVIDER_SITE_OTHER): Payer: Medicaid Other | Admitting: Obstetrics and Gynecology

## 2012-02-27 ENCOUNTER — Encounter: Payer: Self-pay | Admitting: Obstetrics and Gynecology

## 2012-02-27 ENCOUNTER — Other Ambulatory Visit: Payer: Self-pay | Admitting: Obstetrics and Gynecology

## 2012-02-27 VITALS — BP 110/62 | HR 72 | Resp 18 | Ht 60.0 in | Wt 249.0 lb

## 2012-02-27 DIAGNOSIS — N898 Other specified noninflammatory disorders of vagina: Secondary | ICD-10-CM

## 2012-02-27 DIAGNOSIS — R87612 Low grade squamous intraepithelial lesion on cytologic smear of cervix (LGSIL): Secondary | ICD-10-CM

## 2012-02-27 DIAGNOSIS — H547 Unspecified visual loss: Secondary | ICD-10-CM

## 2012-02-27 DIAGNOSIS — R87619 Unspecified abnormal cytological findings in specimens from cervix uteri: Secondary | ICD-10-CM

## 2012-02-27 DIAGNOSIS — IMO0002 Reserved for concepts with insufficient information to code with codable children: Secondary | ICD-10-CM

## 2012-02-27 LAB — POCT WET PREP (WET MOUNT): pH: 5.5

## 2012-02-27 LAB — FUNGUS CULTURE W SMEAR

## 2012-02-27 MED ORDER — TINIDAZOLE 500 MG PO TABS: 2.0000 g | ORAL_TABLET | Freq: Every day | ORAL | Status: AC

## 2012-02-27 NOTE — Progress Notes (Signed)
Addended by: Mathis Bud on: 02/27/2012 03:41 PM   Modules accepted: Orders

## 2012-02-27 NOTE — Progress Notes (Signed)
Date of delivery: 01/05/2012 Female Name: [name] Vaginal delivery:no Cesarean section:yes Tubal ligation:no GDM:no Breast Feeding:no Bottle Feeding:yes Post-Partum Blues:no Abnormal pap:yes Normal GU function: yes Normal GI function:yes Returning to work:no  EPDS = 2  C/o blindness since 01/31/12 Pt dx'd with conversion disorder and saw opthalmologist and had f/u appt scheduled Pt has not seen Psych yet and declined help with scheduling that appt Will sched referral to Neuro  Pelvic exam: normal external genitalia, vulva, vagina, cervix, uterus and adnexa. 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

## 2012-03-01 LAB — PAP IG W/ RFLX HPV ASCU

## 2012-03-07 ENCOUNTER — Other Ambulatory Visit: Payer: Self-pay | Admitting: Obstetrics and Gynecology

## 2014-08-31 ENCOUNTER — Encounter: Payer: Self-pay | Admitting: Obstetrics and Gynecology

## 2014-11-15 ENCOUNTER — Inpatient Hospital Stay (HOSPITAL_COMMUNITY)
Admission: AD | Admit: 2014-11-15 | Discharge: 2014-11-15 | Disposition: A | Discharge status: Home / Self Care | Payer: Medicaid Other | Source: Ambulatory Visit | Attending: Obstetrics & Gynecology | Admitting: Obstetrics & Gynecology

## 2014-11-15 ENCOUNTER — Inpatient Hospital Stay (HOSPITAL_COMMUNITY): Payer: Medicaid Other

## 2014-11-15 ENCOUNTER — Encounter (HOSPITAL_COMMUNITY): Payer: Self-pay | Admitting: *Deleted

## 2014-11-15 DIAGNOSIS — O26892 Other specified pregnancy related conditions, second trimester: Secondary | ICD-10-CM | POA: Insufficient documentation

## 2014-11-15 DIAGNOSIS — O9989 Other specified diseases and conditions complicating pregnancy, childbirth and the puerperium: Secondary | ICD-10-CM

## 2014-11-15 DIAGNOSIS — Z3A19 19 weeks gestation of pregnancy: Secondary | ICD-10-CM | POA: Insufficient documentation

## 2014-11-15 DIAGNOSIS — Z87891 Personal history of nicotine dependence: Secondary | ICD-10-CM | POA: Diagnosis not present

## 2014-11-15 DIAGNOSIS — R109 Unspecified abdominal pain: Secondary | ICD-10-CM | POA: Diagnosis present

## 2014-11-15 DIAGNOSIS — Z3A18 18 weeks gestation of pregnancy: Secondary | ICD-10-CM | POA: Insufficient documentation

## 2014-11-15 DIAGNOSIS — O26899 Other specified pregnancy related conditions, unspecified trimester: Secondary | ICD-10-CM

## 2014-11-15 LAB — CBC
HEMATOCRIT: 35.5 % — AB (ref 36.0–46.0)
HEMOGLOBIN: 11.7 g/dL — AB (ref 12.0–15.0)
MCH: 28.5 pg (ref 26.0–34.0)
MCHC: 33 g/dL (ref 30.0–36.0)
MCV: 86.4 fL (ref 78.0–100.0)
PLATELETS: 189 10*3/uL (ref 150–400)
RBC: 4.11 MIL/uL (ref 3.87–5.11)
RDW: 14.5 % (ref 11.5–15.5)
WBC: 7.6 10*3/uL (ref 4.0–10.5)

## 2014-11-15 LAB — URINALYSIS, ROUTINE W REFLEX MICROSCOPIC
Bilirubin Urine: NEGATIVE
GLUCOSE, UA: NEGATIVE mg/dL
HGB URINE DIPSTICK: NEGATIVE
KETONES UR: NEGATIVE mg/dL
Leukocytes, UA: NEGATIVE
Nitrite: NEGATIVE
PH: 6 (ref 5.0–8.0)
Protein, ur: NEGATIVE mg/dL
Urobilinogen, UA: 0.2 mg/dL (ref 0.0–1.0)

## 2014-11-15 LAB — WET PREP, GENITAL
CLUE CELLS WET PREP: NONE SEEN
TRICH WET PREP: NONE SEEN
Yeast Wet Prep HPF POC: NONE SEEN

## 2014-11-15 LAB — COMPREHENSIVE METABOLIC PANEL
ALK PHOS: 49 U/L (ref 39–117)
ALT: 10 U/L (ref 0–35)
ANION GAP: 5 (ref 5–15)
AST: 11 U/L (ref 0–37)
Albumin: 2.9 g/dL — ABNORMAL LOW (ref 3.5–5.2)
BUN: 8 mg/dL (ref 6–23)
CALCIUM: 8.7 mg/dL (ref 8.4–10.5)
CHLORIDE: 108 meq/L (ref 96–112)
CO2: 24 mmol/L (ref 19–32)
CREATININE: 0.41 mg/dL — AB (ref 0.50–1.10)
GFR calc Af Amer: >90 mL/min (ref >90)
GFR calc non Af Amer: >90 mL/min (ref >90)
Glucose, Bld: 101 mg/dL — ABNORMAL HIGH (ref 70–99)
Potassium: 3.8 mmol/L (ref 3.5–5.1)
Sodium: 137 mmol/L (ref 135–145)
TOTAL PROTEIN: 6.3 g/dL (ref 6.0–8.3)
Total Bilirubin: 0.4 mg/dL (ref 0.3–1.2)

## 2014-11-15 MED ORDER — ACETAMINOPHEN 325 MG PO TABS
650.0000 mg | ORAL_TABLET | Freq: Once | ORAL | Status: AC
  Administered 2014-11-15: 650 mg via ORAL
  Filled 2014-11-15: qty 2

## 2014-11-15 NOTE — MAU Note (Signed)
Pt presents to MAU with complaints of having pain in her lower abdomen. States that she is 18 weeks from her last LMP. Awaiting insurance approval for doctor's appointment

## 2014-11-15 NOTE — MAU Note (Signed)
Awaiting lab results
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
## 2014-11-15 NOTE — Discharge Instructions (Signed)
No smoking, no drugs, no alcohol.  Take a prenatal vitamin one by mouth every day.  Begin prenatal care as soon as possible.   Take Tylenol 325 mg 2 tablets by mouth every 4 hours if needed for pain. Drink at least 8 8-oz glasses of water every day. Do not drink sodas, sweet tea or punch.

## 2014-11-16 LAB — GC/CHLAMYDIA PROBE AMP (~~LOC~~) NOT AT ARMC
CHLAMYDIA, DNA PROBE: NEGATIVE
NEISSERIA GONORRHEA: NEGATIVE

## 2014-11-16 LAB — RPR: RPR: NONREACTIVE

## 2014-11-16 LAB — HIV ANTIBODY (ROUTINE TESTING W REFLEX)
HIV 1/HIV 2 AB: NONREACTIVE
HIV 1/O/2 Abs-Index Value: <1 (ref <1.00)

## 2014-11-19 ENCOUNTER — Ambulatory Visit (HOSPITAL_COMMUNITY)
Admission: RE | Admit: 2014-11-19 | Discharge: 2014-11-19 | Disposition: A | Discharge status: Home / Self Care | Payer: Medicaid Other | Source: Ambulatory Visit | Attending: Nurse Practitioner | Admitting: Nurse Practitioner

## 2014-11-19 ENCOUNTER — Other Ambulatory Visit (HOSPITAL_COMMUNITY): Payer: Self-pay | Admitting: Nurse Practitioner

## 2014-11-19 DIAGNOSIS — R109 Unspecified abdominal pain: Secondary | ICD-10-CM | POA: Diagnosis not present

## 2014-11-19 DIAGNOSIS — O9989 Other specified diseases and conditions complicating pregnancy, childbirth and the puerperium: Secondary | ICD-10-CM | POA: Insufficient documentation

## 2014-11-19 DIAGNOSIS — O26899 Other specified pregnancy related conditions, unspecified trimester: Secondary | ICD-10-CM

## 2014-11-20 DIAGNOSIS — Z3A20 20 weeks gestation of pregnancy: Secondary | ICD-10-CM | POA: Insufficient documentation

## 2014-11-20 DIAGNOSIS — Z3689 Encounter for other specified antenatal screening: Secondary | ICD-10-CM | POA: Insufficient documentation

## 2014-11-20 DIAGNOSIS — O99212 Obesity complicating pregnancy, second trimester: Secondary | ICD-10-CM | POA: Insufficient documentation

## 2014-11-23 ENCOUNTER — Telehealth: Payer: Self-pay | Admitting: General Practice

## 2014-11-23 NOTE — Telephone Encounter (Signed)
-----   Message from Marcelyn DittyKasie Edick Kiser sent at 11/19/2014  4:19 PM EST ----- Regarding: U/S results as the subject We have just completed a 2nd or 3rd trimester outpatient ultrasound scheduled for a patient who was seen in MAU.  Please call the patient with the results.

## 2014-11-23 NOTE — Telephone Encounter (Signed)
Called patient and informed her of results and recommendation of follow up ultrasound in 4 weeks. Patient verbalized understanding. Asked patient if she knew where she would be receiving prenatal care. Patient states her medicaid is still pending but she is hoping to go to [facility]. Told patient to make them aware a follow up ultrasound was recommended in 4 weeks and that we are currently accepting new patients, so if she would like to start care here she certainly could and that it does not matter if her medicaid is pending at our office. She can still start her care. Patient verbalized understanding and states she will definitely think about that. Patient had no other questions at this time.

## 2015-01-18 ENCOUNTER — Other Ambulatory Visit (HOSPITAL_COMMUNITY): Payer: Self-pay | Admitting: Physician Assistant

## 2015-01-18 DIAGNOSIS — Z3689 Encounter for other specified antenatal screening: Secondary | ICD-10-CM

## 2015-01-18 LAB — GLUCOSE TOLERANCE, 1 HOUR (50G) W/O FASTING: Glucose, GTT - 1 Hour: 104 mg/dL (ref ?–200)

## 2015-01-18 LAB — OB RESULTS CONSOLE RPR
RPR: NONREACTIVE
RPR: NONREACTIVE
RPR: NONREACTIVE

## 2015-01-18 LAB — OB RESULTS CONSOLE HIV ANTIBODY (ROUTINE TESTING)
HIV: NONREACTIVE
HIV: NONREACTIVE

## 2015-01-18 LAB — OB RESULTS CONSOLE HEPATITIS B SURFACE ANTIGEN: Hepatitis B Surface Ag: NEGATIVE

## 2015-01-18 LAB — OB RESULTS CONSOLE VARICELLA ZOSTER ANTIBODY, IGG: Varicella: IMMUNE

## 2015-01-18 LAB — CYTOLOGY - PAP: Pap: ABNORMAL — AB

## 2015-01-18 LAB — OB RESULTS CONSOLE GC/CHLAMYDIA
CHLAMYDIA, DNA PROBE: NEGATIVE
Gonorrhea: NEGATIVE

## 2015-01-18 LAB — OB RESULTS CONSOLE ABO/RH: RH Type: POSITIVE

## 2015-01-18 LAB — OB RESULTS CONSOLE ANTIBODY SCREEN: ANTIBODY SCREEN: NEGATIVE

## 2015-01-18 LAB — OB RESULTS CONSOLE RUBELLA ANTIBODY, IGM: Rubella: IMMUNE

## 2015-01-18 LAB — SICKLE CELL SCREEN: SICKLE CELL SCREEN: NORMAL

## 2015-01-20 LAB — DRUG SCREEN, URINE: Drug Screen, Urine: NEGATIVE

## 2015-01-22 ENCOUNTER — Encounter (HOSPITAL_COMMUNITY): Payer: Self-pay | Admitting: Anesthesiology

## 2015-01-22 ENCOUNTER — Encounter (HOSPITAL_COMMUNITY): Payer: Self-pay | Admitting: *Deleted

## 2015-01-22 ENCOUNTER — Inpatient Hospital Stay (HOSPITAL_COMMUNITY)
Admission: AD | Admit: 2015-01-22 | Discharge: 2015-01-23 | Disposition: A | Discharge status: Home / Self Care | Payer: Medicaid Other | Source: Ambulatory Visit | Attending: Family Medicine | Admitting: Family Medicine

## 2015-01-22 DIAGNOSIS — O3421 Maternal care for scar from previous cesarean delivery: Secondary | ICD-10-CM | POA: Insufficient documentation

## 2015-01-22 DIAGNOSIS — Z87891 Personal history of nicotine dependence: Secondary | ICD-10-CM | POA: Diagnosis not present

## 2015-01-22 DIAGNOSIS — Z3A29 29 weeks gestation of pregnancy: Secondary | ICD-10-CM | POA: Diagnosis not present

## 2015-01-22 DIAGNOSIS — O9989 Other specified diseases and conditions complicating pregnancy, childbirth and the puerperium: Secondary | ICD-10-CM | POA: Insufficient documentation

## 2015-01-22 DIAGNOSIS — O368130 Decreased fetal movements, third trimester, not applicable or unspecified: Secondary | ICD-10-CM | POA: Insufficient documentation

## 2015-01-22 DIAGNOSIS — R109 Unspecified abdominal pain: Secondary | ICD-10-CM | POA: Insufficient documentation

## 2015-01-22 DIAGNOSIS — O479 False labor, unspecified: Secondary | ICD-10-CM | POA: Diagnosis not present

## 2015-01-22 LAB — CBC
HCT: 33 % — ABNORMAL LOW (ref 36.0–46.0)
Hemoglobin: 10.8 g/dL — ABNORMAL LOW (ref 12.0–15.0)
MCH: 29.2 pg (ref 26.0–34.0)
MCHC: 32.7 g/dL (ref 30.0–36.0)
MCV: 89.2 fL (ref 78.0–100.0)
PLATELETS: 181 10*3/uL (ref 150–400)
RBC: 3.7 MIL/uL — ABNORMAL LOW (ref 3.87–5.11)
RDW: 13.9 % (ref 11.5–15.5)
WBC: 9.2 10*3/uL (ref 4.0–10.5)

## 2015-01-22 LAB — URINALYSIS, ROUTINE W REFLEX MICROSCOPIC
Bilirubin Urine: NEGATIVE
GLUCOSE, UA: NEGATIVE mg/dL
Hgb urine dipstick: NEGATIVE
KETONES UR: NEGATIVE mg/dL
LEUKOCYTES UA: NEGATIVE
NITRITE: NEGATIVE
PH: 6.5 (ref 5.0–8.0)
PROTEIN: NEGATIVE mg/dL
Specific Gravity, Urine: 1.025 (ref 1.005–1.030)
Urobilinogen, UA: 1 mg/dL (ref 0.0–1.0)

## 2015-01-22 LAB — COMPREHENSIVE METABOLIC PANEL
ALT: 11 U/L (ref 0–35)
AST: 13 U/L (ref 0–37)
Albumin: 2.8 g/dL — ABNORMAL LOW (ref 3.5–5.2)
Alkaline Phosphatase: 77 U/L (ref 39–117)
Anion gap: 6 (ref 5–15)
BUN: 6 mg/dL (ref 6–23)
CO2: 26 mmol/L (ref 19–32)
CREATININE: 0.45 mg/dL — AB (ref 0.50–1.10)
Calcium: 8.8 mg/dL (ref 8.4–10.5)
Chloride: 105 mmol/L (ref 96–112)
GFR calc Af Amer: >90 mL/min (ref >90)
Glucose, Bld: 88 mg/dL (ref 70–99)
Potassium: 3.9 mmol/L (ref 3.5–5.1)
SODIUM: 137 mmol/L (ref 135–145)
TOTAL PROTEIN: 6.2 g/dL (ref 6.0–8.3)
Total Bilirubin: 0.3 mg/dL (ref 0.3–1.2)

## 2015-01-22 LAB — PROTEIN / CREATININE RATIO, URINE
CREATININE, URINE: 283 mg/dL
Protein Creatinine Ratio: 0.05 (ref 0.00–0.15)
Total Protein, Urine: 15 mg/dL

## 2015-01-22 LAB — FETAL FIBRONECTIN: FETAL FIBRONECTIN: NEGATIVE

## 2015-01-22 MED ORDER — LACTATED RINGERS IV BOLUS (SEPSIS)
1000.0000 mL | Freq: Once | INTRAVENOUS | Status: AC
  Administered 2015-01-22: 1000 mL via INTRAVENOUS

## 2015-01-22 MED ORDER — TRAMADOL HCL 50 MG PO TABS
100.0000 mg | ORAL_TABLET | Freq: Once | ORAL | Status: AC
  Administered 2015-01-22: 100 mg via ORAL
  Filled 2015-01-22: qty 2

## 2015-01-22 MED ORDER — NIFEDIPINE 10 MG PO CAPS
10.0000 mg | ORAL_CAPSULE | ORAL | Status: AC
  Administered 2015-01-22 (×3): 10 mg via ORAL
  Filled 2015-01-22 (×3): qty 1

## 2015-01-22 NOTE — MAU Note (Signed)
C/o ucs that are only on the L side since [id number] this afternoon; elevated BPs; no on any hypertensive meds;

## 2015-01-22 NOTE — MAU Note (Signed)
Pt presents complaining of contrations every 3 min since 1650. Hx of previous c/s x2 for preeclampsia. Denies HA or vision changes. Denies leaking or bleeding. Reports decreased fetal movement,.
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
## 2015-01-23 DIAGNOSIS — O479 False labor, unspecified: Secondary | ICD-10-CM

## 2015-01-23 NOTE — Discharge Instructions (Signed)
Braxton Hicks Contractions °Contractions of the uterus can occur throughout pregnancy. Contractions are not always a sign that you are in labor.  °WHAT ARE BRAXTON HICKS CONTRACTIONS?  °Contractions that occur before labor are called Braxton Hicks contractions, or false labor. Toward the end of pregnancy (32-34 weeks), these contractions can develop more often and may become more forceful. This is not true labor because these contractions do not result in opening (dilatation) and thinning of the cervix. They are sometimes difficult to tell apart from true labor because these contractions can be forceful and people have different pain tolerances. You should not feel embarrassed if you go to the hospital with false labor. Sometimes, the only way to tell if you are in true labor is for your health care provider to look for changes in the cervix. °If there are no prenatal problems or other health problems associated with the pregnancy, it is completely safe to be sent home with false labor and await the onset of true labor. °HOW CAN YOU TELL THE DIFFERENCE BETWEEN TRUE AND FALSE LABOR? °False Labor °· The contractions of false labor are usually shorter and not as hard as those of true labor.   °· The contractions are usually irregular.   °· The contractions are often felt in the front of the lower abdomen and in the groin.   °· The contractions may go away when you walk around or change positions while lying down.   °· The contractions get weaker and are shorter lasting as time goes on.   °· The contractions do not usually become progressively stronger, regular, and closer together as with true labor.   °True Labor °· Contractions in true labor last 30-70 seconds, become very regular, usually become more intense, and increase in frequency.   °· The contractions do not go away with walking.   °· The discomfort is usually felt in the top of the uterus and spreads to the lower abdomen and low back.   °· True labor can be  determined by your health care provider with an exam. This will show that the cervix is dilating and getting thinner.   °WHAT TO REMEMBER °· Keep up with your usual exercises and follow other instructions given by your health care provider.   °· Take medicines as directed by your health care provider.   °· Keep your regular prenatal appointments.   °· Eat and drink lightly if you think you are going into labor.   °· If Braxton Hicks contractions are making you uncomfortable:   °¨ Change your position from lying down or resting to walking, or from walking to resting.   °¨ Sit and rest in a tub of warm water.   °¨ Drink 2-3 glasses of water. Dehydration may cause these contractions.   °¨ Do slow and deep breathing several times an hour.   °WHEN SHOULD I SEEK IMMEDIATE MEDICAL CARE? °Seek immediate medical care if: °· Your contractions become stronger, more regular, and closer together.   °· You have fluid leaking or gushing from your vagina.   °· You have a fever.   °· You pass blood-tinged mucus.   °· You have vaginal bleeding.   °· You have continuous abdominal pain.   °· You have low back pain that you never had before.   °· You feel your baby's head pushing down and causing pelvic pressure.   °· Your baby is not moving as much as it used to.   °Document Released: 10/16/2005 Document Revised: 10/21/2013 Document Reviewed: 07/28/2013 °ExitCare® Patient Information ©2015 [employer] LLC. This information is not intended to replace advice given to you by your health care   provider. Make sure you discuss any questions you have with your health care provider. ° °

## 2015-01-25 ENCOUNTER — Ambulatory Visit (HOSPITAL_COMMUNITY)
Admission: RE | Admit: 2015-01-25 | Discharge: 2015-01-25 | Disposition: A | Discharge status: Home / Self Care | Payer: Medicaid Other | Source: Ambulatory Visit | Attending: Physician Assistant | Admitting: Physician Assistant

## 2015-01-25 ENCOUNTER — Other Ambulatory Visit (HOSPITAL_COMMUNITY): Payer: Self-pay | Admitting: Physician Assistant

## 2015-01-25 DIAGNOSIS — Z36 Encounter for antenatal screening of mother: Secondary | ICD-10-CM | POA: Insufficient documentation

## 2015-01-25 DIAGNOSIS — Z3689 Encounter for other specified antenatal screening: Secondary | ICD-10-CM

## 2015-01-29 ENCOUNTER — Inpatient Hospital Stay (HOSPITAL_COMMUNITY)
Admission: AD | Admit: 2015-01-29 | Discharge: 2015-01-29 | Disposition: A | Discharge status: Home / Self Care | Payer: Medicaid Other | Source: Ambulatory Visit | Attending: Obstetrics & Gynecology | Admitting: Obstetrics & Gynecology

## 2015-01-29 ENCOUNTER — Encounter (HOSPITAL_COMMUNITY): Payer: Self-pay | Admitting: *Deleted

## 2015-01-29 DIAGNOSIS — Z3A30 30 weeks gestation of pregnancy: Secondary | ICD-10-CM | POA: Diagnosis not present

## 2015-01-29 DIAGNOSIS — O163 Unspecified maternal hypertension, third trimester: Secondary | ICD-10-CM | POA: Diagnosis not present

## 2015-01-29 DIAGNOSIS — O9989 Other specified diseases and conditions complicating pregnancy, childbirth and the puerperium: Secondary | ICD-10-CM | POA: Insufficient documentation

## 2015-01-29 DIAGNOSIS — M5432 Sciatica, left side: Secondary | ICD-10-CM | POA: Diagnosis not present

## 2015-01-29 DIAGNOSIS — Z0489 Encounter for examination and observation for other specified reasons: Secondary | ICD-10-CM | POA: Insufficient documentation

## 2015-01-29 DIAGNOSIS — IMO0002 Reserved for concepts with insufficient information to code with codable children: Secondary | ICD-10-CM | POA: Insufficient documentation

## 2015-01-29 DIAGNOSIS — Z87891 Personal history of nicotine dependence: Secondary | ICD-10-CM | POA: Diagnosis not present

## 2015-01-29 DIAGNOSIS — M79606 Pain in leg, unspecified: Secondary | ICD-10-CM | POA: Diagnosis present

## 2015-01-29 DIAGNOSIS — O99210 Obesity complicating pregnancy, unspecified trimester: Secondary | ICD-10-CM

## 2015-01-29 MED ORDER — ACETAMINOPHEN 500 MG PO TABS
1000.0000 mg | ORAL_TABLET | Freq: Once | ORAL | Status: AC
  Administered 2015-01-29: 1000 mg via ORAL
  Filled 2015-01-29: qty 2
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
## 2015-01-29 NOTE — Discharge Instructions (Signed)
Sciatica °Sciatica is pain, weakness, numbness, or tingling along the path of the sciatic nerve. The nerve starts in the lower back and runs down the back of each leg. The nerve controls the muscles in the lower leg and in the back of the knee, while also providing sensation to the back of the thigh, lower leg, and the sole of your foot. Sciatica is a symptom of another medical condition. For instance, nerve damage or certain conditions, such as a herniated disk or bone spur on the spine, pinch or put pressure on the sciatic nerve. This causes the pain, weakness, or other sensations normally associated with sciatica. Generally, sciatica only affects one side of the body. °CAUSES  °· Herniated or slipped disc. °· Degenerative disk disease. °· A pain disorder involving the narrow muscle in the buttocks (piriformis syndrome). °· Pelvic injury or fracture. °· Pregnancy. °· Tumor (rare). °SYMPTOMS  °Symptoms can vary from mild to very severe. The symptoms usually travel from the low back to the buttocks and down the back of the leg. Symptoms can include: °· Mild tingling or dull aches in the lower back, leg, or hip. °· Numbness in the back of the calf or sole of the foot. °· Burning sensations in the lower back, leg, or hip. °· Sharp pains in the lower back, leg, or hip. °· Leg weakness. °· Severe back pain inhibiting movement. °These symptoms may get worse with coughing, sneezing, laughing, or prolonged sitting or standing. Also, being overweight may worsen symptoms. °DIAGNOSIS  °Your caregiver will perform a physical exam to look for common symptoms of sciatica. He or she may ask you to do certain movements or activities that would trigger sciatic nerve pain. Other tests may be performed to find the cause of the sciatica. These may include: °· Blood tests. °· X-rays. °· Imaging tests, such as an MRI or CT scan. °TREATMENT  °Treatment is directed at the cause of the sciatic pain. Sometimes, treatment is not necessary  and the pain and discomfort goes away on its own. If treatment is needed, your caregiver may suggest: °· Over-the-counter medicines to relieve pain. °· Prescription medicines, such as anti-inflammatory medicine, muscle relaxants, or narcotics. °· Applying heat or ice to the painful area. °· Steroid injections to lessen pain, irritation, and inflammation around the nerve. °· Reducing activity during periods of pain. °· Exercising and stretching to strengthen your abdomen and improve flexibility of your spine. Your caregiver may suggest losing weight if the extra weight makes the back pain worse. °· Physical therapy. °· Surgery to eliminate what is pressing or pinching the nerve, such as a bone spur or part of a herniated disk. °HOME CARE INSTRUCTIONS  °· Only take over-the-counter or prescription medicines for pain or discomfort as directed by your caregiver. °· Apply ice to the affected area for 20 minutes, 3-4 times a day for the first 48-72 hours. Then try heat in the same way. °· Exercise, stretch, or perform your usual activities if these do not aggravate your pain. °· Attend physical therapy sessions as directed by your caregiver. °· Keep all follow-up appointments as directed by your caregiver. °· Do not wear high heels or shoes that do not provide proper support. °· Check your mattress to see if it is too soft. A firm mattress may lessen your pain and discomfort. °SEEK IMMEDIATE MEDICAL CARE IF:  °· You lose control of your bowel or bladder (incontinence). °· You have increasing weakness in the lower back, pelvis, buttocks,   or legs.  You have redness or swelling of your back.  You have a burning sensation when you urinate.  You have pain that gets worse when you lie down or awakens you at night.  Your pain is worse than you have experienced in the past.  Your pain is lasting longer than 4 weeks.  You are suddenly losing weight without reason. MAKE SURE YOU:  Understand these  instructions.  Will watch your condition.  Will get help right away if you are not doing well or get worse. Document Released: 10/10/2001 Document Revised: 04/16/2012 Document Reviewed: 02/25/2012 [facility] Patient Information 2015 [employer] [place]. This information is not intended to replace advice given to you by your health care provider. Make sure you discuss any questions you have with your health care provider.  BACK: Child's Pose (Sciatica)   Sit in knee-chest position and reach arms forward. Separate knees for comfort. Hold position for 5 breaths. Repeat 3 times. Do 3 times per day.  Copyright  VHI. All rights reserved.   Safe Medications in Pregnancy   Acne: Benzoyl Peroxide Salicylic Acid  Backache/Headache: Tylenol: 2 regular strength every 4 hours OR              2 Extra strength every 6 hours  Colds/Coughs/Allergies: Benadryl (alcohol free) 25 mg every 6 hours as needed Breath right strips Claritin Cepacol throat lozenges Chloraseptic throat spray Cold-Eeze- up to three times per day Cough drops, alcohol free Flonase (by prescription only) Guaifenesin Mucinex Robitussin DM (plain only, alcohol free) Saline nasal spray/drops Sudafed (pseudoephedrine) & Actifed ** use only after [redacted] weeks gestation and if you do not have high blood pressure Tylenol Vicks Vaporub Zinc lozenges Zyrtec   Constipation: Colace Ducolax suppositories Fleet enema Glycerin suppositories Metamucil Milk of magnesia Miralax Senokot Smooth move tea  Diarrhea: Kaopectate Imodium A-D  *NO pepto Bismol  Hemorrhoids: Anusol Anusol HC Preparation H Tucks  Indigestion: Tums Maalox Mylanta Zantac  Pepcid  Insomnia: Benadryl (alcohol free) 25mg  every 6 hours as needed Tylenol PM Unisom, no Gelcaps  Leg Cramps: Tums MagGel  Nausea/Vomiting:  Bonine Dramamine Emetrol Ginger extract Sea bands Meclizine  Nausea medication to take during pregnancy:  Unisom  (doxylamine succinate 25 mg tablets) Take one tablet daily at bedtime. If symptoms are not adequately controlled, the dose can be increased to a maximum recommended dose of two tablets daily (1/2 tablet in the morning, 1/2 tablet mid-afternoon and one at bedtime). Vitamin B6 100mg  tablets. Take one tablet twice a day (up to 200 mg per day).  Skin Rashes: Aveeno products Benadryl cream or 25mg  every 6 hours as needed Calamine Lotion 1% cortisone cream  Yeast infection: Gyne-lotrimin 7 Monistat 7   **If taking multiple medications, please check labels to avoid duplicating the same active ingredients **take medication as directed on the label ** Do not exceed 4000 mg of tylenol in 24 hours **Do not take medications that contain aspirin or ibuprofen

## 2015-01-29 NOTE — MAU Note (Signed)
Applied heat to pts lower back and Left hip.

## 2015-01-29 NOTE — MAU Note (Signed)
Pt started having numbness in left leg on Friday 3/25.  Pain in same leg on Sunday 3/27.  Still having pain and numbness. Denies vaginal bleeding or discharge.

## 2015-02-02 ENCOUNTER — Telehealth: Payer: Self-pay

## 2015-02-02 NOTE — Telephone Encounter (Signed)
Patient called stating she woke up this morning not feeling well and c/o nasal congestion, body aches and states "feels like I have a bad cold." Denies fever. Informed patient that viruses are going around and is likely. Asked if she has HTN-- patient reports she does in pregnancy. Advised she try OTC decongestants-- advised she avoid ingredients phenylephrine and pseudoephedrine along with Sudafed due to HTN. Also advised she ensure she stays well hydrated, rests when she can and eats small meals to ensure proper nutrition. Informed her viruses take several days to get over.  Patient verbalized understanding and gratitude.No further questions or concerns. Has initial visit with clinic scheduled for 02/11/15.

## 2015-02-04 ENCOUNTER — Other Ambulatory Visit (HOSPITAL_COMMUNITY): Payer: Self-pay | Admitting: Physician Assistant

## 2015-02-04 DIAGNOSIS — Z0374 Encounter for suspected problem with fetal growth ruled out: Secondary | ICD-10-CM

## 2015-02-11 ENCOUNTER — Ambulatory Visit (INDEPENDENT_AMBULATORY_CARE_PROVIDER_SITE_OTHER): Payer: Medicaid Other | Admitting: Obstetrics & Gynecology

## 2015-02-11 ENCOUNTER — Encounter: Payer: Self-pay | Admitting: Obstetrics & Gynecology

## 2015-02-11 VITALS — BP 121/73 | HR 106 | Temp 98.3°F | Wt 293.1 lb

## 2015-02-11 DIAGNOSIS — O139 Gestational [pregnancy-induced] hypertension without significant proteinuria, unspecified trimester: Secondary | ICD-10-CM

## 2015-02-11 DIAGNOSIS — Z62810 Personal history of physical and sexual abuse in childhood: Secondary | ICD-10-CM

## 2015-02-11 DIAGNOSIS — O99213 Obesity complicating pregnancy, third trimester: Secondary | ICD-10-CM

## 2015-02-11 DIAGNOSIS — O3421 Maternal care for scar from previous cesarean delivery: Secondary | ICD-10-CM

## 2015-02-11 DIAGNOSIS — O09293 Supervision of pregnancy with other poor reproductive or obstetric history, third trimester: Secondary | ICD-10-CM

## 2015-02-11 DIAGNOSIS — O0933 Supervision of pregnancy with insufficient antenatal care, third trimester: Secondary | ICD-10-CM

## 2015-02-11 DIAGNOSIS — O34219 Maternal care for unspecified type scar from previous cesarean delivery: Secondary | ICD-10-CM | POA: Insufficient documentation

## 2015-02-11 DIAGNOSIS — K429 Umbilical hernia without obstruction or gangrene: Secondary | ICD-10-CM

## 2015-02-11 LAB — POCT URINALYSIS DIP (DEVICE)
Bilirubin Urine: NEGATIVE
Glucose, UA: NEGATIVE mg/dL
Hgb urine dipstick: NEGATIVE
Nitrite: NEGATIVE
PH: 6.5 (ref 5.0–8.0)
Protein, ur: NEGATIVE mg/dL
Specific Gravity, Urine: 1.02 (ref 1.005–1.030)
UROBILINOGEN UA: 1 mg/dL (ref 0.0–1.0)

## 2015-02-11 MED ORDER — ASPIRIN EC 81 MG PO TBEC: 81.0000 mg | DELAYED_RELEASE_TABLET | Freq: Every day | ORAL | Status: DC

## 2015-02-11 NOTE — Progress Notes (Addendum)
[facility]; had elevated blood pressures on 01/18/15 and 01/20/15 visit.  History of preclampsia in previous pregnancy No symptoms today.  Had growth scan on 01/25/15 with EFW at 68%, normal AFV; scheduled for another one 6 weeks later ~ 36 weeks [x]  Baseline labs - all normal, 24 hour urine protein was 154 mg on 01/20/15 [x]  Baby ASA prescribed after 12 weeks [x]  Serial growth scans 20-24-28-32-35-38 [x]  Antenatal testing starting at 32 weeks - start next week [x]  Delivery by 39 weeks by [facility] Discussed implications of GHTN in pregnancy, need for antenatal testing and frequent ultrasounds/prenatal visits, need for optimizing BP control to decrease GHTN/preeclampsia associated maternal-fetal morbidity and mortality.  Patient had two previous cesarean sections, will be scheduled at 39 weeks.  BMI >58. Patient has 8 cm x 7 cm asymptomatic umbilical hernia. Will continue to monitor.  Had hernia repair (different site) after last pregnancy. No other complaints or concerns.  Preeclampsia, preterm labor and fetal movement precautions reviewed.

## 2015-02-11 NOTE — Patient Instructions (Signed)

## 2015-02-11 NOTE — Progress Notes (Signed)
[facility] Up to date on lab work.  [facility]  Edema in feet.  C/o left leg numbness and side thigh pain.

## 2015-02-15 ENCOUNTER — Other Ambulatory Visit: Payer: Medicaid Other

## 2015-02-15 ENCOUNTER — Encounter: Payer: Self-pay | Admitting: *Deleted

## 2015-02-16 ENCOUNTER — Ambulatory Visit (INDEPENDENT_AMBULATORY_CARE_PROVIDER_SITE_OTHER): Payer: Medicaid Other | Admitting: *Deleted

## 2015-02-16 VITALS — BP 127/57 | HR 110

## 2015-02-16 DIAGNOSIS — O133 Gestational [pregnancy-induced] hypertension without significant proteinuria, third trimester: Secondary | ICD-10-CM | POA: Diagnosis present

## 2015-02-16 DIAGNOSIS — O139 Gestational [pregnancy-induced] hypertension without significant proteinuria, unspecified trimester: Secondary | ICD-10-CM

## 2015-02-17 ENCOUNTER — Telehealth: Payer: Self-pay | Admitting: *Deleted

## 2015-02-17 NOTE — Progress Notes (Signed)
NST reviewed and reactive.  Wilder Kurowski L. Harraway-Smith, M.D., FACOG    

## 2015-02-17 NOTE — Telephone Encounter (Signed)
Pt contacted the clinic requesting a letter to a physician that gives details of medication that can be given/x-rays.  Contacted patient, pt needs a Statisticiand[employer] letter.  Informed patient that we would give her the letter 4/21 when she comes in for NST.

## 2015-02-18 ENCOUNTER — Encounter: Payer: Self-pay | Admitting: Obstetrics & Gynecology

## 2015-02-18 ENCOUNTER — Ambulatory Visit (INDEPENDENT_AMBULATORY_CARE_PROVIDER_SITE_OTHER): Payer: Medicaid Other | Admitting: Obstetrics & Gynecology

## 2015-02-18 VITALS — BP 108/44 | HR 103 | Wt 300.0 lb

## 2015-02-18 DIAGNOSIS — O09293 Supervision of pregnancy with other poor reproductive or obstetric history, third trimester: Secondary | ICD-10-CM

## 2015-02-18 DIAGNOSIS — O133 Gestational [pregnancy-induced] hypertension without significant proteinuria, third trimester: Secondary | ICD-10-CM

## 2015-02-18 DIAGNOSIS — O139 Gestational [pregnancy-induced] hypertension without significant proteinuria, unspecified trimester: Secondary | ICD-10-CM

## 2015-02-18 LAB — POCT URINALYSIS DIP (DEVICE)
GLUCOSE, UA: NEGATIVE mg/dL
Hgb urine dipstick: NEGATIVE
Nitrite: NEGATIVE
PROTEIN: NEGATIVE mg/dL
SPECIFIC GRAVITY, URINE: 1.02 (ref 1.005–1.030)
UROBILINOGEN UA: 2 mg/dL — AB (ref 0.0–1.0)
pH: 7 (ref 5.0–8.0)

## 2015-02-18 NOTE — Progress Notes (Signed)
OBF/NST/AFI Bilirubin: small, Ketones: trace, Urobilinogen: 2.0, leukocytes: small

## 2015-02-18 NOTE — Patient Instructions (Signed)

## 2015-02-22 ENCOUNTER — Ambulatory Visit (INDEPENDENT_AMBULATORY_CARE_PROVIDER_SITE_OTHER): Payer: Medicaid Other | Admitting: *Deleted

## 2015-02-22 VITALS — BP 124/56 | HR 118

## 2015-02-22 DIAGNOSIS — O139 Gestational [pregnancy-induced] hypertension without significant proteinuria, unspecified trimester: Secondary | ICD-10-CM

## 2015-02-22 NOTE — Progress Notes (Signed)
NST performed today was reviewed and was found to be reactive.  Continue recommended antenatal testing and prenatal care.  

## 2015-02-25 ENCOUNTER — Ambulatory Visit (INDEPENDENT_AMBULATORY_CARE_PROVIDER_SITE_OTHER): Payer: Medicaid Other | Admitting: Obstetrics and Gynecology

## 2015-02-25 ENCOUNTER — Encounter: Payer: Self-pay | Admitting: Obstetrics and Gynecology

## 2015-02-25 VITALS — BP 92/48 | HR 111 | Wt 301.4 lb

## 2015-02-25 DIAGNOSIS — O99213 Obesity complicating pregnancy, third trimester: Secondary | ICD-10-CM

## 2015-02-25 DIAGNOSIS — Z62810 Personal history of physical and sexual abuse in childhood: Secondary | ICD-10-CM

## 2015-02-25 DIAGNOSIS — O0933 Supervision of pregnancy with insufficient antenatal care, third trimester: Secondary | ICD-10-CM

## 2015-02-25 DIAGNOSIS — O139 Gestational [pregnancy-induced] hypertension without significant proteinuria, unspecified trimester: Secondary | ICD-10-CM

## 2015-02-25 DIAGNOSIS — O133 Gestational [pregnancy-induced] hypertension without significant proteinuria, third trimester: Secondary | ICD-10-CM | POA: Diagnosis not present

## 2015-02-25 DIAGNOSIS — O3421 Maternal care for scar from previous cesarean delivery: Secondary | ICD-10-CM

## 2015-02-25 DIAGNOSIS — O34219 Maternal care for unspecified type scar from previous cesarean delivery: Secondary | ICD-10-CM

## 2015-02-25 DIAGNOSIS — O09293 Supervision of pregnancy with other poor reproductive or obstetric history, third trimester: Secondary | ICD-10-CM

## 2015-02-25 LAB — POCT URINALYSIS DIP (DEVICE)
GLUCOSE, UA: NEGATIVE mg/dL
HGB URINE DIPSTICK: NEGATIVE
Ketones, ur: 80 mg/dL — AB
NITRITE: NEGATIVE
PROTEIN: 30 mg/dL — AB
Specific Gravity, Urine: >=1.03 (ref 1.005–1.030)
UROBILINOGEN UA: 2 mg/dL — AB (ref 0.0–1.0)
pH: 6 (ref 5.0–8.0)

## 2015-02-25 MED ORDER — ZOLPIDEM TARTRATE 5 MG PO TABS: 5.0000 mg | ORAL_TABLET | Freq: Every evening | ORAL | Status: DC | PRN

## 2015-02-25 NOTE — Progress Notes (Signed)
Routine/NST/AFI  Pt is concerned that she cannot sleep

## 2015-02-25 NOTE — Progress Notes (Signed)
Patient is doing well without complaints. FM/PTL precautions reviewed. Rx Ambien provided. F/u ultrasound on 5/12 NST reviewed and reactive

## 2015-02-25 NOTE — Progress Notes (Signed)
Small leuks on Udip 

## 2015-03-01 ENCOUNTER — Ambulatory Visit (INDEPENDENT_AMBULATORY_CARE_PROVIDER_SITE_OTHER): Payer: Medicaid Other | Admitting: *Deleted

## 2015-03-01 VITALS — BP 123/52 | HR 106

## 2015-03-01 DIAGNOSIS — O139 Gestational [pregnancy-induced] hypertension without significant proteinuria, unspecified trimester: Secondary | ICD-10-CM

## 2015-03-03 ENCOUNTER — Encounter: Payer: Self-pay | Admitting: *Deleted

## 2015-03-04 ENCOUNTER — Ambulatory Visit (INDEPENDENT_AMBULATORY_CARE_PROVIDER_SITE_OTHER): Payer: Medicaid Other | Admitting: Family Medicine

## 2015-03-04 VITALS — BP 138/71 | HR 106 | Wt 301.4 lb

## 2015-03-04 DIAGNOSIS — O139 Gestational [pregnancy-induced] hypertension without significant proteinuria, unspecified trimester: Secondary | ICD-10-CM

## 2015-03-04 LAB — CBC
HCT: 34.8 % — ABNORMAL LOW (ref 36.0–46.0)
Hemoglobin: 11.4 g/dL — ABNORMAL LOW (ref 12.0–15.0)
MCH: 28.4 pg (ref 26.0–34.0)
MCHC: 32.8 g/dL (ref 30.0–36.0)
MCV: 86.6 fL (ref 78.0–100.0)
MPV: 12.3 fL (ref 8.6–12.4)
PLATELETS: 192 10*3/uL (ref 150–400)
RBC: 4.02 MIL/uL (ref 3.87–5.11)
RDW: 14 % (ref 11.5–15.5)
WBC: 7.7 10*3/uL (ref 4.0–10.5)

## 2015-03-04 LAB — COMPREHENSIVE METABOLIC PANEL
ALK PHOS: 105 U/L (ref 39–117)
AST: 10 U/L (ref 0–37)
Albumin: 2.8 g/dL — ABNORMAL LOW (ref 3.5–5.2)
BILIRUBIN TOTAL: 0.4 mg/dL (ref 0.2–1.2)
BUN: 5 mg/dL — ABNORMAL LOW (ref 6–23)
CO2: 21 mEq/L (ref 19–32)
CREATININE: 0.49 mg/dL — AB (ref 0.50–1.10)
Calcium: 8.3 mg/dL — ABNORMAL LOW (ref 8.4–10.5)
Chloride: 105 mEq/L (ref 96–112)
Glucose, Bld: 72 mg/dL (ref 70–99)
Potassium: 3.7 mEq/L (ref 3.5–5.3)
Sodium: 138 mEq/L (ref 135–145)
Total Protein: 6 g/dL (ref 6.0–8.3)

## 2015-03-04 LAB — US OB LIMITED

## 2015-03-04 LAB — POCT URINALYSIS DIP (DEVICE)
BILIRUBIN URINE: NEGATIVE
Glucose, UA: NEGATIVE mg/dL
Hgb urine dipstick: NEGATIVE
Ketones, ur: 40 mg/dL — AB
NITRITE: NEGATIVE
Protein, ur: 30 mg/dL — AB
Specific Gravity, Urine: 1.025 (ref 1.005–1.030)
UROBILINOGEN UA: 2 mg/dL — AB (ref 0.0–1.0)
pH: 7 (ref 5.0–8.0)

## 2015-03-04 NOTE — Progress Notes (Signed)
NST reviewed and reactive. BP borderline--reports headache and vision changes--check labs today U/S for growth, next week.

## 2015-03-04 NOTE — Progress Notes (Signed)
Ketones: 40, Urobilinogen 2.0, leukocytes: small

## 2015-03-04 NOTE — Patient Instructions (Signed)
Preeclampsia and Eclampsia Preeclampsia is a serious condition that develops only during pregnancy. It is also called toxemia of pregnancy. This condition causes high blood pressure along with other symptoms, such as swelling and headaches. These may develop as the condition gets worse. Preeclampsia may occur 20 weeks or later into your pregnancy.  Diagnosing and treating preeclampsia early is very important. If not treated early, it can cause serious problems for you and your baby. One problem it can lead to is eclampsia, which is a condition that causes muscle jerking or shaking (convulsions) in the mother. Delivering your baby is the best treatment for preeclampsia or eclampsia.  RISK FACTORS The cause of preeclampsia is not known. You may be more likely to develop preeclampsia if you have certain risk factors. These include:   Being pregnant for the first time.  Having preeclampsia in a past pregnancy.  Having a family history of preeclampsia.  Having high blood pressure.  Being pregnant with twins or triplets.  Being 35 or older.  Being African American.  Having kidney disease or diabetes.  Having medical conditions such as lupus or blood diseases.  Being very overweight (obese). SIGNS AND SYMPTOMS  The earliest signs of preeclampsia are:  High blood pressure.  Increased protein in your urine. Your health care provider will check for this at every prenatal visit. Other symptoms that can develop include:   Severe headaches.  Sudden weight gain.  Swelling of your hands, face, legs, and feet.  Feeling sick to your stomach (nauseous) and throwing up (vomiting).  Vision problems (blurred or double vision).  Numbness in your face, arms, legs, and feet.  Dizziness.  Slurred speech.  Sensitivity to bright lights.  Abdominal pain. DIAGNOSIS  There are no screening tests for preeclampsia. Your health care provider will ask you about symptoms and check for signs of  preeclampsia during your prenatal visits. You may also have tests, including:  Urine testing.  Blood testing.  Checking your baby's heart rate.  Checking the health of your baby and your placenta using images created with sound waves (ultrasound). TREATMENT  You can work out the best treatment approach together with your health care provider. It is very important to keep all prenatal appointments. If you have an increased risk of preeclampsia, you may need more frequent prenatal exams.  Your health care provider may prescribe bed rest.  You may have to eat as little salt as possible.  You may need to take medicine to lower your blood pressure if the condition does not respond to more conservative measures.  You may need to stay in the hospital if your condition is severe. There, treatment will focus on controlling your blood pressure and fluid retention. You may also need to take medicine to prevent seizures.  If the condition gets worse, your baby may need to be delivered early to protect you and the baby. You may have your labor started with medicine (be induced), or you may have a cesarean delivery.  Preeclampsia usually goes away after the baby is born. HOME CARE INSTRUCTIONS   Only take over-the-counter or prescription medicines as directed by your health care provider.  Lie on your left side while resting. This keeps pressure off your baby.  Elevate your feet while resting.  Get regular exercise. Ask your health care provider what type of exercise is safe for you.  Avoid caffeine and alcohol.  Do not smoke.  Drink 6-8 glasses of water every day.  Eat a balanced diet   that is low in salt. Do not add salt to your food.  Avoid stressful situations as much as possible.  Get plenty of rest and sleep.  Keep all prenatal appointments and tests as scheduled. SEEK MEDICAL CARE IF:  You are gaining more weight than expected.  You have any headaches, abdominal pain, or  nausea.  You are bruising more than usual.  You feel dizzy or light-headed. SEEK IMMEDIATE MEDICAL CARE IF:   You develop sudden or severe swelling anywhere in your body. This usually happens in the legs.  You gain 5 lb (2.3 kg) or more in a week.  You have a severe headache, dizziness, problems with your vision, or confusion.  You have severe abdominal pain.  You have lasting nausea or vomiting.  You have a seizure.  You have trouble moving any part of your body.  You develop numbness in your body.  You have trouble speaking.  You have any abnormal bleeding.  You develop a stiff neck.  You pass out. MAKE SURE YOU:   Understand these instructions.  Will watch your condition.  Will get help right away if you are not doing well or get worse. Document Released: 10/13/2000 Document Revised: 10/21/2013 Document Reviewed: 08/08/2013 [facility] Patient Information 2015 [employer], [place]. This information is not intended to replace advice given to you by your health care provider. Make sure you discuss any questions you have with your health care provider.  Postpartum Depression and Baby Blues The postpartum period begins right after the birth of a baby. During this time, there is often a great amount of joy and excitement. It is also a time of many changes in the life of the parents. Regardless of how many times a mother gives birth, each child brings new challenges and dynamics to the family. It is not unusual to have feelings of excitement along with confusing shifts in moods, emotions, and thoughts. All mothers are at risk of developing postpartum depression or the "baby blues." These mood changes can occur right after giving birth, or they may occur many months after giving birth. The baby blues or postpartum depression can be mild or severe. Additionally, postpartum depression can go away rather quickly, or it can be a long-term condition.  CAUSES Raised hormone levels and the  rapid drop in those levels are thought to be a main cause of postpartum depression and the baby blues. A number of hormones change during and after pregnancy. Estrogen and progesterone usually decrease right after the delivery of your baby. The levels of thyroid hormone and various cortisol steroids also rapidly drop. Other factors that play a role in these mood changes include major life events and genetics.  RISK FACTORS If you have any of the following risks for the baby blues or postpartum depression, know what symptoms to watch out for during the postpartum period. Risk factors that may increase the likelihood of getting the baby blues or postpartum depression include:  Having a personal or family history of depression.   Having depression while being pregnant.   Having premenstrual mood issues or mood issues related to oral contraceptives.  Having a lot of life stress.   Having marital conflict.   Lacking a social support network.   Having a baby with special needs.   Having health problems, such as diabetes.  SIGNS AND SYMPTOMS Symptoms of baby blues include:  Brief changes in mood, such as going from extreme happiness to sadness.  Decreased concentration.   Difficulty sleeping.  Crying spells, tearfulness.   Irritability.   Anxiety.  Symptoms of postpartum depression typically begin within the first month after giving birth. These symptoms include:  Difficulty sleeping or excessive sleepiness.   Marked weight loss.   Agitation.   Feelings of worthlessness.   Lack of interest in activity or food.  Postpartum psychosis is a very serious condition and can be dangerous. Fortunately, it is rare. Displaying any of the following symptoms is cause for immediate medical attention. Symptoms of postpartum psychosis include:   Hallucinations and delusions.   Bizarre or disorganized behavior.   Confusion or disorientation.  DIAGNOSIS  A diagnosis is  made by an evaluation of your symptoms. There are no medical or lab tests that lead to a diagnosis, but there are various questionnaires that a health care provider may use to identify those with the baby blues, postpartum depression, or psychosis. Often, a screening tool called the [place] Postnatal Depression Scale is used to diagnose depression in the postpartum period.  TREATMENT The baby blues usually goes away on its own in 1-2 weeks. Social support is often all that is needed. You will be encouraged to get adequate sleep and rest. Occasionally, you may be given medicines to help you sleep.  Postpartum depression requires treatment because it can last several months or longer if it is not treated. Treatment may include individual or group therapy, medicine, or both to address any social, physiological, and psychological factors that may play a role in the depression. Regular exercise, a healthy diet, rest, and social support may also be strongly recommended.  Postpartum psychosis is more serious and needs treatment right away. Hospitalization is often needed. HOME CARE INSTRUCTIONS  Get as much rest as you can. Nap when the baby sleeps.   Exercise regularly. Some women find yoga and walking to be beneficial.   Eat a balanced and nourishing diet.   Do little things that you enjoy. Have a cup of tea, take a bubble bath, read your favorite magazine, or listen to your favorite music.  Avoid alcohol.   Ask for help with household chores, cooking, grocery shopping, or running errands as needed. Do not try to do everything.   Talk to people close to you about how you are feeling. Get support from your partner, family members, friends, or other new moms.  Try to stay positive in how you think. Think about the things you are grateful for.   Do not spend a lot of time alone.   Only take over-the-counter or prescription medicine as directed by your health care provider.  Keep all your  postpartum appointments.   Let your health care provider know if you have any concerns.  SEEK MEDICAL CARE IF: You are having a reaction to or problems with your medicine. SEEK IMMEDIATE MEDICAL CARE IF:  You have suicidal feelings.   You think you may harm the baby or someone else. MAKE SURE YOU:  Understand these instructions.  Will watch your condition.  Will get help right away if you are not doing well or get worse. Document Released: 07/20/2004 Document Revised: 10/21/2013 Document Reviewed: 07/28/2013 [facility] Patient Information 2015 [place], [place]. This information is not intended to replace advice given to you by your health care provider. Make sure you discuss any questions you have with your health care provider.

## 2015-03-05 LAB — PROTEIN / CREATININE RATIO, URINE
Creatinine, Urine: 192.9 mg/dL
PROTEIN CREATININE RATIO: 0.13 (ref <0.15)
TOTAL PROTEIN, URINE: 25 mg/dL — AB (ref 5–24)

## 2015-03-08 ENCOUNTER — Ambulatory Visit (INDEPENDENT_AMBULATORY_CARE_PROVIDER_SITE_OTHER): Payer: Medicaid Other | Admitting: *Deleted

## 2015-03-08 ENCOUNTER — Ambulatory Visit (HOSPITAL_COMMUNITY): Payer: Medicaid Other

## 2015-03-08 VITALS — BP 133/67 | HR 109

## 2015-03-08 DIAGNOSIS — O139 Gestational [pregnancy-induced] hypertension without significant proteinuria, unspecified trimester: Secondary | ICD-10-CM

## 2015-03-09 NOTE — Progress Notes (Signed)
03/08/15 NST reviewed and reactive 

## 2015-03-11 ENCOUNTER — Ambulatory Visit (HOSPITAL_COMMUNITY)
Admission: RE | Admit: 2015-03-11 | Discharge: 2015-03-11 | Disposition: A | Discharge status: Home / Self Care | Payer: Medicaid Other | Source: Ambulatory Visit | Attending: Physician Assistant | Admitting: Physician Assistant

## 2015-03-11 ENCOUNTER — Encounter: Payer: Self-pay | Admitting: Obstetrics & Gynecology

## 2015-03-11 ENCOUNTER — Ambulatory Visit (INDEPENDENT_AMBULATORY_CARE_PROVIDER_SITE_OTHER): Payer: Medicaid Other | Admitting: Obstetrics & Gynecology

## 2015-03-11 ENCOUNTER — Other Ambulatory Visit: Payer: Self-pay | Admitting: Obstetrics & Gynecology

## 2015-03-11 VITALS — BP 139/66 | HR 98 | Wt 304.0 lb

## 2015-03-11 DIAGNOSIS — O0933 Supervision of pregnancy with insufficient antenatal care, third trimester: Secondary | ICD-10-CM

## 2015-03-11 DIAGNOSIS — O133 Gestational [pregnancy-induced] hypertension without significant proteinuria, third trimester: Secondary | ICD-10-CM | POA: Diagnosis present

## 2015-03-11 DIAGNOSIS — O139 Gestational [pregnancy-induced] hypertension without significant proteinuria, unspecified trimester: Secondary | ICD-10-CM

## 2015-03-11 DIAGNOSIS — Z0374 Encounter for suspected problem with fetal growth ruled out: Secondary | ICD-10-CM

## 2015-03-11 LAB — POCT URINALYSIS DIP (DEVICE)
Bilirubin Urine: NEGATIVE
Glucose, UA: NEGATIVE mg/dL
Hgb urine dipstick: NEGATIVE
Ketones, ur: 40 mg/dL — AB
NITRITE: NEGATIVE
PROTEIN: NEGATIVE mg/dL
Specific Gravity, Urine: 1.025 (ref 1.005–1.030)
Urobilinogen, UA: 1 mg/dL (ref 0.0–1.0)
pH: 7 (ref 5.0–8.0)

## 2015-03-11 NOTE — Progress Notes (Signed)
US for growth done today 

## 2015-03-11 NOTE — Progress Notes (Signed)
Will try to get to 39 weeks, patient wants TOLAC if possible, was told of risks and benefits, that IOL would not be done, need to review consent and sign. NST reactive today

## 2015-03-12 ENCOUNTER — Other Ambulatory Visit: Payer: Self-pay | Admitting: Obstetrics & Gynecology

## 2015-03-12 LAB — GC/CHLAMYDIA PROBE AMP
CT Probe RNA: NEGATIVE
GC PROBE AMP APTIMA: NEGATIVE

## 2015-03-12 LAB — CULTURE, BETA STREP (GROUP B ONLY)

## 2015-03-15 ENCOUNTER — Other Ambulatory Visit: Payer: Medicaid Other

## 2015-03-16 ENCOUNTER — Encounter: Payer: Self-pay | Admitting: *Deleted

## 2015-03-17 NOTE — Progress Notes (Signed)
NST reactive 03/01/15 

## 2015-03-18 ENCOUNTER — Ambulatory Visit (INDEPENDENT_AMBULATORY_CARE_PROVIDER_SITE_OTHER): Payer: Medicaid Other | Admitting: Family

## 2015-03-18 VITALS — BP 135/75 | HR 98 | Wt 302.0 lb

## 2015-03-18 DIAGNOSIS — N898 Other specified noninflammatory disorders of vagina: Secondary | ICD-10-CM

## 2015-03-18 DIAGNOSIS — O139 Gestational [pregnancy-induced] hypertension without significant proteinuria, unspecified trimester: Secondary | ICD-10-CM | POA: Diagnosis present

## 2015-03-18 DIAGNOSIS — O0933 Supervision of pregnancy with insufficient antenatal care, third trimester: Secondary | ICD-10-CM

## 2015-03-18 DIAGNOSIS — L298 Other pruritus: Secondary | ICD-10-CM

## 2015-03-18 LAB — POCT URINALYSIS DIP (DEVICE)
BILIRUBIN URINE: NEGATIVE
Glucose, UA: NEGATIVE mg/dL
Hgb urine dipstick: NEGATIVE
KETONES UR: 40 mg/dL — AB
NITRITE: NEGATIVE
PROTEIN: NEGATIVE mg/dL
SPECIFIC GRAVITY, URINE: 1.025 (ref 1.005–1.030)
Urobilinogen, UA: 1 mg/dL (ref 0.0–1.0)
pH: 6.5 (ref 5.0–8.0)

## 2015-03-18 MED ORDER — FLUCONAZOLE 150 MG PO TABS: 150.0000 mg | ORAL_TABLET | Freq: Once | ORAL | Status: DC

## 2015-03-18 NOTE — Progress Notes (Signed)
Reviewed last growth ultrasound at 35 wks (82%ile); NST reactive today.  Reports white vaginal discharge.  RX diflucan sent to pharmacy.  Recently finished amoxicillin for 3 pulled teeth.

## 2015-03-18 NOTE — Progress Notes (Signed)
Ketones - 40 and small leuks on udip. Pt reports white vaginal discharge and itching. Pt had 3 teeth pulled 2 days ago.

## 2015-03-22 ENCOUNTER — Ambulatory Visit (INDEPENDENT_AMBULATORY_CARE_PROVIDER_SITE_OTHER): Payer: Medicaid Other | Admitting: *Deleted

## 2015-03-22 VITALS — BP 141/75 | HR 92

## 2015-03-22 DIAGNOSIS — O139 Gestational [pregnancy-induced] hypertension without significant proteinuria, unspecified trimester: Secondary | ICD-10-CM | POA: Diagnosis not present

## 2015-03-25 ENCOUNTER — Ambulatory Visit (INDEPENDENT_AMBULATORY_CARE_PROVIDER_SITE_OTHER): Payer: Medicaid Other | Admitting: Family Medicine

## 2015-03-25 ENCOUNTER — Encounter: Payer: Medicaid Other | Admitting: Obstetrics & Gynecology

## 2015-03-25 VITALS — BP 138/82 | HR 98 | Temp 98.5°F | Wt 301.8 lb

## 2015-03-25 DIAGNOSIS — O34219 Maternal care for unspecified type scar from previous cesarean delivery: Secondary | ICD-10-CM

## 2015-03-25 DIAGNOSIS — O3421 Maternal care for scar from previous cesarean delivery: Secondary | ICD-10-CM

## 2015-03-25 DIAGNOSIS — O139 Gestational [pregnancy-induced] hypertension without significant proteinuria, unspecified trimester: Secondary | ICD-10-CM

## 2015-03-25 DIAGNOSIS — O133 Gestational [pregnancy-induced] hypertension without significant proteinuria, third trimester: Secondary | ICD-10-CM

## 2015-03-25 LAB — POCT URINALYSIS DIP (DEVICE)
Bilirubin Urine: NEGATIVE
Glucose, UA: NEGATIVE mg/dL
Hgb urine dipstick: NEGATIVE
Ketones, ur: 15 mg/dL — AB
Nitrite: NEGATIVE
PH: 6 (ref 5.0–8.0)
Protein, ur: NEGATIVE mg/dL
Specific Gravity, Urine: >=1.03 (ref 1.005–1.030)
Urobilinogen, UA: 1 mg/dL (ref 0.0–1.0)

## 2015-03-25 NOTE — Progress Notes (Signed)
Reports no fetal movement today so far Good FM during NST.  Diane Day RNC

## 2015-03-25 NOTE — Patient Instructions (Signed)
Breastfeeding Deciding to breastfeed is one of the best choices you can make for you and your baby. A change in hormones during pregnancy causes your breast tissue to grow and increases the number and size of your milk ducts. These hormones also allow proteins, sugars, and fats from your blood supply to make breast milk in your milk-producing glands. Hormones prevent breast milk from being released before your baby is born as well as prompt milk flow after birth. Once breastfeeding has begun, thoughts of your baby, as well as his or her sucking or crying, can stimulate the release of milk from your milk-producing glands.  BENEFITS OF BREASTFEEDING For Your Baby  Your first milk (colostrum) helps your baby's digestive system function better.   There are antibodies in your milk that help your baby fight off infections.   Your baby has a lower incidence of asthma, allergies, and sudden infant death syndrome.   The nutrients in breast milk are better for your baby than infant formulas and are designed uniquely for your baby's needs.   Breast milk improves your baby's brain development.   Your baby is less likely to develop other conditions, such as childhood obesity, asthma, or type 2 diabetes mellitus.  For You   Breastfeeding helps to create a very special bond between you and your baby.   Breastfeeding is convenient. Breast milk is always available at the correct temperature and costs nothing.   Breastfeeding helps to burn calories and helps you lose the weight gained during pregnancy.   Breastfeeding makes your uterus contract to its prepregnancy size faster and slows bleeding (lochia) after you give birth.   Breastfeeding helps to lower your risk of developing type 2 diabetes mellitus, osteoporosis, and breast or ovarian cancer later in life. SIGNS THAT YOUR BABY IS HUNGRY Early Signs of Hunger  Increased alertness or activity.  Stretching.  Movement of the head from  side to side.  Movement of the head and opening of the mouth when the corner of the mouth or cheek is stroked (rooting).  Increased sucking sounds, smacking lips, cooing, sighing, or squeaking.  Hand-to-mouth movements.  Increased sucking of fingers or hands. Late Signs of Hunger  Fussing.  Intermittent crying. Extreme Signs of Hunger Signs of extreme hunger will require calming and consoling before your baby will be able to breastfeed successfully. Do not wait for the following signs of extreme hunger to occur before you initiate breastfeeding:   Restlessness.  A loud, strong cry.   Screaming. BREASTFEEDING BASICS Breastfeeding Initiation  Find a comfortable place to sit or lie down, with your neck and back well supported.  Place a pillow or rolled up blanket under your baby to bring him or her to the level of your breast (if you are seated). Nursing pillows are specially designed to help support your arms and your baby while you breastfeed.  Make sure that your baby's abdomen is facing your abdomen.   Gently massage your breast. With your fingertips, massage from your chest wall toward your nipple in a circular motion. This encourages milk flow. You may need to continue this action during the feeding if your milk flows slowly.  Support your breast with 4 fingers underneath and your thumb above your nipple. Make sure your fingers are well away from your nipple and your baby's mouth.   Stroke your baby's lips gently with your finger or nipple.   When your baby's mouth is open wide enough, quickly bring your baby to your   breast, placing your entire nipple and as much of the colored area around your nipple (areola) as possible into your baby's mouth.   More areola should be visible above your baby's upper lip than below the lower lip.   Your baby's tongue should be between his or her lower gum and your breast.   Ensure that your baby's mouth is correctly positioned  around your nipple (latched). Your baby's lips should create a seal on your breast and be turned out (everted).  It is common for your baby to suck about 2-3 minutes in order to start the flow of breast milk. Latching Teaching your baby how to latch on to your breast properly is very important. An improper latch can cause nipple pain and decreased milk supply for you and poor weight gain in your baby. Also, if your baby is not latched onto your nipple properly, he or she may swallow some air during feeding. This can make your baby fussy. Burping your baby when you switch breasts during the feeding can help to get rid of the air. However, teaching your baby to latch on properly is still the best way to prevent fussiness from swallowing air while breastfeeding. Signs that your baby has successfully latched on to your nipple:    Silent tugging or silent sucking, without causing you pain.   Swallowing heard between every 3-4 sucks.    Muscle movement above and in front of his or her ears while sucking.  Signs that your baby has not successfully latched on to nipple:   Sucking sounds or smacking sounds from your baby while breastfeeding.  Nipple pain. If you think your baby has not latched on correctly, slip your finger into the corner of your baby's mouth to break the suction and place it between your baby's gums. Attempt breastfeeding initiation again. Signs of Successful Breastfeeding Signs from your baby:   A gradual decrease in the number of sucks or complete cessation of sucking.   Falling asleep.   Relaxation of his or her body.   Retention of a small amount of milk in his or her mouth.   Letting go of your breast by himself or herself. Signs from you:  Breasts that have increased in firmness, weight, and size 1-3 hours after feeding.   Breasts that are softer immediately after breastfeeding.  Increased milk volume, as well as a change in milk consistency and color by  the fifth day of breastfeeding.   Nipples that are not sore, cracked, or bleeding. Signs That Your Baby is Getting Enough Milk  Wetting at least 3 diapers in a 24-hour period. The urine should be clear and pale yellow by age 5 days.  At least 3 stools in a 24-hour period by age 5 days. The stool should be soft and yellow.  At least 3 stools in a 24-hour period by age 7 days. The stool should be seedy and yellow.  No loss of weight greater than 10% of birth weight during the first 3 days of age.  Average weight gain of 4-7 ounces (113-198 g) per week after age 4 days.  Consistent daily weight gain by age 5 days, without weight loss after the age of 2 weeks. After a feeding, your baby may spit up a small amount. This is common. BREASTFEEDING FREQUENCY AND DURATION Frequent feeding will help you make more milk and can prevent sore nipples and breast engorgement. Breastfeed when you feel the need to reduce the fullness of your breasts   or when your baby shows signs of hunger. This is called "breastfeeding on demand." Avoid introducing a pacifier to your baby while you are working to establish breastfeeding (the first 4-6 weeks after your baby is born). After this time you may choose to use a pacifier. Research has shown that pacifier use during the first year of a baby's life decreases the risk of sudden infant death syndrome (SIDS). Allow your baby to feed on each breast as long as he or she wants. Breastfeed until your baby is finished feeding. When your baby unlatches or falls asleep while feeding from the first breast, offer the second breast. Because newborns are often sleepy in the first few weeks of life, you may need to awaken your baby to get him or her to feed. Breastfeeding times will vary from baby to baby. However, the following rules can serve as a guide to help you ensure that your baby is properly fed:  Newborns (babies 4 weeks of age or younger) may breastfeed every 1-3  hours.  Newborns should not go longer than 3 hours during the day or 5 hours during the night without breastfeeding.  You should breastfeed your baby a minimum of 8 times in a 24-hour period until you begin to introduce solid foods to your baby at around 6 months of age. BREAST MILK PUMPING Pumping and storing breast milk allows you to ensure that your baby is exclusively fed your breast milk, even at times when you are unable to breastfeed. This is especially important if you are going back to work while you are still breastfeeding or when you are not able to be present during feedings. Your lactation consultant can give you guidelines on how long it is safe to store breast milk.  A breast pump is a machine that allows you to pump milk from your breast into a sterile bottle. The pumped breast milk can then be stored in a refrigerator or freezer. Some breast pumps are operated by hand, while others use electricity. Ask your lactation consultant which type will work best for you. Breast pumps can be purchased, but some hospitals and breastfeeding support groups lease breast pumps on a monthly basis. A lactation consultant can teach you how to hand express breast milk, if you prefer not to use a pump.  CARING FOR YOUR BREASTS WHILE YOU BREASTFEED Nipples can become dry, cracked, and sore while breastfeeding. The following recommendations can help keep your breasts moisturized and healthy:  Avoid using soap on your nipples.   Wear a supportive bra. Although not required, special nursing bras and tank tops are designed to allow access to your breasts for breastfeeding without taking off your entire bra or top. Avoid wearing underwire-style bras or extremely tight bras.  Air dry your nipples for 3-4minutes after each feeding.   Use only cotton bra pads to absorb leaked breast milk. Leaking of breast milk between feedings is normal.   Use lanolin on your nipples after breastfeeding. Lanolin helps to  maintain your skin's normal moisture barrier. If you use pure lanolin, you do not need to wash it off before feeding your baby again. Pure lanolin is not toxic to your baby. You may also hand express a few drops of breast milk and gently massage that milk into your nipples and allow the milk to air dry. In the first few weeks after giving birth, some women experience extremely full breasts (engorgement). Engorgement can make your breasts feel heavy, warm, and tender to the   touch. Engorgement peaks within 3-5 days after you give birth. The following recommendations can help ease engorgement:  Completely empty your breasts while breastfeeding or pumping. You may want to start by applying warm, moist heat (in the shower or with warm water-soaked hand towels) just before feeding or pumping. This increases circulation and helps the milk flow. If your baby does not completely empty your breasts while breastfeeding, pump any extra milk after he or she is finished.  Wear a snug bra (nursing or regular) or tank top for 1-2 days to signal your body to slightly decrease milk production.  Apply ice packs to your breasts, unless this is too uncomfortable for you.  Make sure that your baby is latched on and positioned properly while breastfeeding. If engorgement persists after 48 hours of following these recommendations, contact your health care provider or a Advertising copywriterlactation consultant. OVERALL HEALTH CARE RECOMMENDATIONS WHILE BREASTFEEDING  Eat healthy foods. Alternate between meals and snacks, eating 3 of each per day. Because what you eat affects your breast milk, some of the foods may make your baby more irritable than usual. Avoid eating these foods if you are sure that they are negatively affecting your baby.  Drink milk, fruit juice, and water to satisfy your thirst (about 10 glasses a day).   Rest often, relax, and continue to take your prenatal vitamins to prevent fatigue, stress, and anemia.  Continue  breast self-awareness checks.  Avoid chewing and smoking tobacco.  Avoid alcohol and drug use. Some medicines that may be harmful to your baby can pass through breast milk. It is important to ask your health care provider before taking any medicine, including all over-the-counter and prescription medicine as well as vitamin and herbal supplements. It is possible to become pregnant while breastfeeding. If birth control is desired, ask your health care provider about options that will be safe for your baby. SEEK MEDICAL CARE IF:   You feel like you want to stop breastfeeding or have become frustrated with breastfeeding.  You have painful breasts or nipples.  Your nipples are cracked or bleeding.  Your breasts are red, tender, or warm.  You have a swollen area on either breast.  You have a fever or chills.  You have nausea or vomiting.  You have drainage other than breast milk from your nipples.  Your breasts do not become full before feedings by the fifth day after you give birth.  You feel sad and depressed.  Your baby is too sleepy to eat well.  Your baby is having trouble sleeping.   Your baby is wetting less than 3 diapers in a 24-hour period.  Your baby has less than 3 stools in a 24-hour period.  Your baby's skin or the white part of his or her eyes becomes yellow.   Your baby is not gaining weight by 575 days of age. SEEK IMMEDIATE MEDICAL CARE IF:   Your baby is overly tired (lethargic) and does not want to wake up and feed.  Your baby develops an unexplained fever. Document Released: 10/16/2005 Document Revised: 10/21/2013 Document Reviewed: 04/09/2013 [facility] Patient Information 2015 [place], [place]. This information is not intended to replace advice given to you by your health care provider. Make sure you discuss any questions you have with your health care provider.  Trial of Labor After Cesarean Delivery Information A trial of labor after cesarean delivery  (TOLAC) is when a woman tries to give birth vaginally after a previous cesarean delivery. TOLAC may be a  safe and appropriate option for you depending on your medical history and other risk factors. When TOLAC is successful and you are able to have a vaginal delivery, this is called a vaginal birth after cesarean delivery (VBAC).  CANDIDATES FOR TOLAC TOLAC is possible for some women who:  Have undergone one or two prior cesarean deliveries in which the incision of the uterus was horizontal (low transverse).  Are carrying twins and have had one prior low transverse incision during a cesarean delivery.  Do not have a vertical (classical) uterine scar.  Have not had a tear in the wall of their uterus (uterine rupture). TOLAC is also supported for women who meet appropriate criteria and:  Are under the age of 40 years.  Are tall and have a body mass index (BMI) of less than 30.  Have an unknown uterine scar.  Give birth in a facility equipped to handle an emergency cesarean delivery. This team should be able to handle possible complications such as a uterine rupture.  Have thorough counseling about the benefits and risks of TOLAC.  Have discussed future pregnancy plans with their health care provider.  Plan to have several more pregnancies. MOST SUCCESSFUL CANDIDATES FOR TOLAC:  Have had a successful vaginal delivery before or after their cesarean delivery.  Experience labor that begins naturally on or before the due date (40 weeks of gestation).  Do not have a very large (macrosomic) baby.   Had a prior cesarean delivery but are not currently experiencing factors that would prompt a cesarean delivery (such as a breech position).  Had only one prior cesarean delivery.  Had a prior cesarean delivery that was performed early in labor and not after full cervical dilation. TOLAC may be most appropriate for women who meet the above guidelines and who plan to have more pregnancies.  TOLAC is not recommended for home births. LEAST SUCCESSFUL CANDIDATES FOR TOLAC:  Have an induced labor with an unfavorable cervix. An unfavorable cervix is when the cervix is not dilating enough (among other factors).  Have never had a vaginal delivery.  Have had more than two cesarean deliveries.  Have a pregnancy at more than 40 weeks of gestation.  Are pregnant with a baby with a suspected weight greater than 4,000 grams (8 pounds) and who have no prior history of a vaginal delivery.  Have closely spaced pregnancies. SUGGESTED BENEFITS OF TOLAC  You may have a faster recovery time.  You may have a shorter stay in the hospital.  You may have less pain and fewer problems than with a cesarean delivery. Women who have a cesarean delivery have a higher chance of needing blood or getting a fever, an infection, or a blood clot in the legs. SUGGESTED RISKS OF TOLAC The highest risk of complications happens to women who attempt a TOLAC and fail. A failed TOLAC results in an unplanned cesarean delivery. Risks related to TOLAC or repeat cesarean deliveries include:   Blood loss.  Infection.  Blood clot.  Injury to surrounding tissues or organs.  Having to remove the uterus (hysterectomy).  Potential problems with the placenta (such as placenta previa or placenta accreta) in future pregnancies. Although very rare, the main concerns with TOLAC are:  Rupture of the uterine scar from a past cesarean delivery.  Needing an emergency cesarean delivery.  Having a bad outcome for the baby (perinatal morbidity). FOR MORE INFORMATION American Congress of Obstetricians and Gynecologists: www.acog.org American College of Nurse-Midwives: www.midwife.org Document Released: 07/04/2011 Document Revised:   08/06/2013 Document Reviewed: 04/07/2013 ExitCare Patient Information 2015 [place]. This information is not intended to replace advice given to you by your health care provider. Make  sure you discuss any questions you have with your health care provider.

## 2015-03-26 ENCOUNTER — Other Ambulatory Visit: Payer: Self-pay | Admitting: Family Medicine

## 2015-03-26 NOTE — Progress Notes (Signed)
Subjective:   Laura Austin is a 24 y.o. [id number] 8335w0d being seen today for her obstetrical visit.  Patient reports decreased fetal movement.  Denies contractions, vaginal bleeding or leaking of fluid.  Reports good fetal movement.  The following portions of the patient's history were reviewed and updated as appropriate: allergies, current medications, past family history, past medical history, past social history, past surgical history and problem list.   Objective:  BP 138/82 mmHg  Pulse 98  Temp(Src) 98.5 F (36.9 C)  Wt 301 lb 12.8 oz (136.896 kg)  LMP 07/03/2014  FHT: Fetal Heart Rate (bpm): 128  Uterine Size: Fundal Height: 40 cm  Fetal Movement: Movement: (!) Decreased  Presentation: Presentation: Vertex    Abdomen:  soft, gravid, appropriate for gestational age,non-tender   NST reviewed and reactive.   Assessment and Plan:   Pregnancy:  [id number] at 835w0d  1. Previous cesarean section x 2 , antepartum condition For elective repeat at 39 wks--she would like a TOL but will have C-section if no spontaneous labor  2. Gestational hypertension, antepartum Normal BP's. - Amniotic fluid index with NST Reassuring fetal testing. Fetal movement precautions reviewed.  Follow up in 1 week.  Reva Boresanya S Shawntia Mangal, MD

## 2015-03-30 ENCOUNTER — Ambulatory Visit (INDEPENDENT_AMBULATORY_CARE_PROVIDER_SITE_OTHER): Payer: Medicaid Other | Admitting: *Deleted

## 2015-03-30 VITALS — BP 129/80 | HR 101

## 2015-03-30 DIAGNOSIS — O139 Gestational [pregnancy-induced] hypertension without significant proteinuria, unspecified trimester: Secondary | ICD-10-CM | POA: Diagnosis not present

## 2015-03-30 NOTE — Progress Notes (Signed)
NST reactive.

## 2015-03-31 ENCOUNTER — Encounter (HOSPITAL_COMMUNITY): Payer: Self-pay

## 2015-04-01 ENCOUNTER — Encounter (HOSPITAL_COMMUNITY)
Admission: RE | Admit: 2015-04-01 | Discharge: 2015-04-01 | Disposition: A | Discharge status: Home / Self Care | Payer: Medicaid Other | Source: Ambulatory Visit | Attending: Family Medicine | Admitting: Family Medicine

## 2015-04-01 ENCOUNTER — Encounter (HOSPITAL_COMMUNITY): Payer: Self-pay

## 2015-04-01 VITALS — BP 135/96 | HR 105 | Temp 98.6°F | Resp 18 | Ht 60.0 in | Wt 310.0 lb

## 2015-04-01 DIAGNOSIS — K429 Umbilical hernia without obstruction or gangrene: Secondary | ICD-10-CM

## 2015-04-01 DIAGNOSIS — O09293 Supervision of pregnancy with other poor reproductive or obstetric history, third trimester: Secondary | ICD-10-CM

## 2015-04-01 DIAGNOSIS — O139 Gestational [pregnancy-induced] hypertension without significant proteinuria, unspecified trimester: Secondary | ICD-10-CM

## 2015-04-01 DIAGNOSIS — O0933 Supervision of pregnancy with insufficient antenatal care, third trimester: Secondary | ICD-10-CM

## 2015-04-01 DIAGNOSIS — O34219 Maternal care for unspecified type scar from previous cesarean delivery: Secondary | ICD-10-CM

## 2015-04-01 HISTORY — DX: Cough: R05

## 2015-04-01 HISTORY — DX: Cough, unspecified: R05.9

## 2015-04-01 LAB — CBC
HEMATOCRIT: 33.8 % — AB (ref 36.0–46.0)
Hemoglobin: 10.9 g/dL — ABNORMAL LOW (ref 12.0–15.0)
MCH: 28.8 pg (ref 26.0–34.0)
MCHC: 32.2 g/dL (ref 30.0–36.0)
MCV: 89.4 fL (ref 78.0–100.0)
Platelets: 161 10*3/uL (ref 150–400)
RBC: 3.78 MIL/uL — AB (ref 3.87–5.11)
RDW: 14.2 % (ref 11.5–15.5)
WBC: 7.1 10*3/uL (ref 4.0–10.5)

## 2015-04-01 MED ORDER — DEXTROSE 5 % IV SOLN: 3.0000 g | INTRAVENOUS | Status: DC

## 2015-04-01 MED ORDER — LACTATED RINGERS IV SOLN: INTRAVENOUS | Status: DC

## 2015-04-01 MED ORDER — DEXTROSE 5 % IV SOLN
2.0000 g | INTRAVENOUS | Status: AC
  Administered 2015-04-02: 2 g via INTRAVENOUS
  Filled 2015-04-01: qty 2

## 2015-04-01 NOTE — Patient Instructions (Addendum)
Your procedure is scheduled on:04/02/15  Enter through the Main Entrance at :0830 am Pick up desk phone and dial [phone] and inform us of your arrival.  Please call [phone] if you have any problems the morning of surgery.  Remember: Do not eat food or drink liquids, including water, after midnight:tonight   You may brush your teeth the morning of surgery.   DO NOT wear jewelry, eye make-up, lipstick,body lotion, or dark fingernail polish.  (Polished toes are ok) You may wear deodorant.  If you are to be admitted after surgery, leave suitcase in car until your room has been assigned. Patients discharged on the day of surgery will not be allowed to drive home. Wear loose fitting, comfortable clothes for your ride home.

## 2015-04-02 ENCOUNTER — Inpatient Hospital Stay (HOSPITAL_COMMUNITY)
Admission: RE | Admit: 2015-04-02 | Discharge: 2015-04-04 | DRG: 765 | Disposition: A | Discharge status: Home / Self Care | Payer: Medicaid Other | Source: Ambulatory Visit | Attending: Family Medicine | Admitting: Family Medicine

## 2015-04-02 ENCOUNTER — Inpatient Hospital Stay (HOSPITAL_COMMUNITY): Payer: Medicaid Other | Admitting: Anesthesiology

## 2015-04-02 ENCOUNTER — Encounter (HOSPITAL_COMMUNITY): Payer: Self-pay | Admitting: Anesthesiology

## 2015-04-02 ENCOUNTER — Encounter (HOSPITAL_COMMUNITY)
Admission: RE | Disposition: A | Discharge status: Home / Self Care | Payer: Self-pay | Source: Ambulatory Visit | Attending: Family Medicine

## 2015-04-02 DIAGNOSIS — O3421 Maternal care for scar from previous cesarean delivery: Principal | ICD-10-CM | POA: Diagnosis present

## 2015-04-02 DIAGNOSIS — Z8249 Family history of ischemic heart disease and other diseases of the circulatory system: Secondary | ICD-10-CM

## 2015-04-02 DIAGNOSIS — K429 Umbilical hernia without obstruction or gangrene: Secondary | ICD-10-CM | POA: Diagnosis present

## 2015-04-02 DIAGNOSIS — O09293 Supervision of pregnancy with other poor reproductive or obstetric history, third trimester: Secondary | ICD-10-CM

## 2015-04-02 DIAGNOSIS — O133 Gestational [pregnancy-induced] hypertension without significant proteinuria, third trimester: Secondary | ICD-10-CM | POA: Diagnosis not present

## 2015-04-02 DIAGNOSIS — O99214 Obesity complicating childbirth: Secondary | ICD-10-CM | POA: Diagnosis present

## 2015-04-02 DIAGNOSIS — O139 Gestational [pregnancy-induced] hypertension without significant proteinuria, unspecified trimester: Secondary | ICD-10-CM

## 2015-04-02 DIAGNOSIS — O99210 Obesity complicating pregnancy, unspecified trimester: Secondary | ICD-10-CM

## 2015-04-02 DIAGNOSIS — Z3A39 39 weeks gestation of pregnancy: Secondary | ICD-10-CM | POA: Diagnosis present

## 2015-04-02 DIAGNOSIS — Z823 Family history of stroke: Secondary | ICD-10-CM

## 2015-04-02 DIAGNOSIS — O34219 Maternal care for unspecified type scar from previous cesarean delivery: Secondary | ICD-10-CM

## 2015-04-02 DIAGNOSIS — O0930 Supervision of pregnancy with insufficient antenatal care, unspecified trimester: Secondary | ICD-10-CM

## 2015-04-02 DIAGNOSIS — Z833 Family history of diabetes mellitus: Secondary | ICD-10-CM | POA: Diagnosis not present

## 2015-04-02 DIAGNOSIS — Z8614 Personal history of Methicillin resistant Staphylococcus aureus infection: Secondary | ICD-10-CM | POA: Diagnosis not present

## 2015-04-02 DIAGNOSIS — O0933 Supervision of pregnancy with insufficient antenatal care, third trimester: Secondary | ICD-10-CM

## 2015-04-02 DIAGNOSIS — Z6841 Body Mass Index (BMI) 40.0 and over, adult: Secondary | ICD-10-CM | POA: Diagnosis not present

## 2015-04-02 DIAGNOSIS — Z87891 Personal history of nicotine dependence: Secondary | ICD-10-CM | POA: Diagnosis not present

## 2015-04-02 DIAGNOSIS — Z98891 History of uterine scar from previous surgery: Secondary | ICD-10-CM

## 2015-04-02 LAB — RPR: RPR: NONREACTIVE

## 2015-04-02 LAB — PREPARE RBC (CROSSMATCH)

## 2015-04-02 SURGERY — Surgical Case
Anesthesia: Spinal

## 2015-04-02 MED ORDER — WITCH HAZEL-GLYCERIN EX PADS: 1.0000 "application " | MEDICATED_PAD | CUTANEOUS | Status: DC | PRN

## 2015-04-02 MED ORDER — PROMETHAZINE HCL 25 MG/ML IJ SOLN
INTRAMUSCULAR | Status: AC
Start: 2015-04-02 — End: 2015-04-03
  Filled 2015-04-02: qty 1

## 2015-04-02 MED ORDER — LACTATED RINGERS IV SOLN
Freq: Once | INTRAVENOUS | Status: AC
  Administered 2015-04-02: 09:00:00 via INTRAVENOUS

## 2015-04-02 MED ORDER — NALBUPHINE HCL 10 MG/ML IJ SOLN
5.0000 mg | Freq: Once | INTRAMUSCULAR | Status: AC | PRN
  Filled 2015-04-02: qty 1

## 2015-04-02 MED ORDER — PHENYLEPHRINE 8 MG IN D5W 100 ML (0.08MG/ML) PREMIX OPTIME
INJECTION | INTRAVENOUS | Status: DC | PRN
  Administered 2015-04-02: 60 ug/min via INTRAVENOUS

## 2015-04-02 MED ORDER — PRENATAL MULTIVITAMIN CH
1.0000 | ORAL_TABLET | Freq: Every day | ORAL | Status: DC
  Administered 2015-04-03 – 2015-04-04 (×2): 1 via ORAL
  Filled 2015-04-02 (×2): qty 1

## 2015-04-02 MED ORDER — LACTATED RINGERS IV SOLN: INTRAVENOUS | Status: DC

## 2015-04-02 MED ORDER — NALBUPHINE HCL 10 MG/ML IJ SOLN: 5.0000 mg | Freq: Once | INTRAMUSCULAR | Status: AC | PRN

## 2015-04-02 MED ORDER — TETANUS-DIPHTH-ACELL PERTUSSIS 5-2.5-18.5 LF-MCG/0.5 IM SUSP: 0.5000 mL | Freq: Once | INTRAMUSCULAR | Status: DC

## 2015-04-02 MED ORDER — SIMETHICONE 80 MG PO CHEW: 80.0000 mg | CHEWABLE_TABLET | ORAL | Status: DC | PRN

## 2015-04-02 MED ORDER — BUPIVACAINE IN DEXTROSE 0.75-8.25 % IT SOLN
INTRATHECAL | Status: DC | PRN
  Administered 2015-04-02: 1.2 mL via INTRATHECAL

## 2015-04-02 MED ORDER — ONDANSETRON HCL 4 MG/2ML IJ SOLN
INTRAMUSCULAR | Status: DC | PRN
  Administered 2015-04-02: 4 mg via INTRAVENOUS

## 2015-04-02 MED ORDER — OXYTOCIN 10 UNIT/ML IJ SOLN
40.0000 [IU] | INTRAVENOUS | Status: DC | PRN
  Administered 2015-04-02: 40 [IU] via INTRAVENOUS

## 2015-04-02 MED ORDER — DIBUCAINE 1 % RE OINT: 1.0000 "application " | TOPICAL_OINTMENT | RECTAL | Status: DC | PRN

## 2015-04-02 MED ORDER — KETOROLAC TROMETHAMINE 30 MG/ML IJ SOLN
INTRAMUSCULAR | Status: AC
  Filled 2015-04-02: qty 1

## 2015-04-02 MED ORDER — DIPHENHYDRAMINE HCL 25 MG PO CAPS: 25.0000 mg | ORAL_CAPSULE | ORAL | Status: DC | PRN

## 2015-04-02 MED ORDER — LACTATED RINGERS IV SOLN
INTRAVENOUS | Status: DC
  Administered 2015-04-02: 10:00:00 via INTRAVENOUS

## 2015-04-02 MED ORDER — NALBUPHINE HCL 10 MG/ML IJ SOLN
INTRAMUSCULAR | Status: AC
  Administered 2015-04-02: 10 mg via SUBCUTANEOUS
  Filled 2015-04-02: qty 1

## 2015-04-02 MED ORDER — MEPERIDINE HCL 25 MG/ML IJ SOLN: 6.2500 mg | INTRAMUSCULAR | Status: DC | PRN

## 2015-04-02 MED ORDER — DIPHENHYDRAMINE HCL 25 MG PO CAPS: 25.0000 mg | ORAL_CAPSULE | Freq: Four times a day (QID) | ORAL | Status: DC | PRN

## 2015-04-02 MED ORDER — SENNOSIDES-DOCUSATE SODIUM 8.6-50 MG PO TABS
2.0000 | ORAL_TABLET | ORAL | Status: DC
  Administered 2015-04-03 (×2): 2 via ORAL
  Filled 2015-04-02 (×2): qty 2

## 2015-04-02 MED ORDER — FENTANYL CITRATE (PF) 100 MCG/2ML IJ SOLN
INTRAMUSCULAR | Status: AC
  Filled 2015-04-02: qty 2

## 2015-04-02 MED ORDER — SIMETHICONE 80 MG PO CHEW
80.0000 mg | CHEWABLE_TABLET | ORAL | Status: DC
  Administered 2015-04-03 (×2): 80 mg via ORAL
  Filled 2015-04-02 (×2): qty 1

## 2015-04-02 MED ORDER — OXYCODONE-ACETAMINOPHEN 5-325 MG PO TABS
2.0000 | ORAL_TABLET | ORAL | Status: DC | PRN
  Administered 2015-04-03 – 2015-04-04 (×2): 2 via ORAL
  Filled 2015-04-02 (×2): qty 2

## 2015-04-02 MED ORDER — KETOROLAC TROMETHAMINE 30 MG/ML IJ SOLN: 30.0000 mg | Freq: Four times a day (QID) | INTRAMUSCULAR | Status: AC | PRN

## 2015-04-02 MED ORDER — SCOPOLAMINE 1 MG/3DAYS TD PT72
1.0000 | MEDICATED_PATCH | Freq: Once | TRANSDERMAL | Status: DC
  Administered 2015-04-02: 1.5 mg via TRANSDERMAL

## 2015-04-02 MED ORDER — MORPHINE SULFATE 0.5 MG/ML IJ SOLN
INTRAMUSCULAR | Status: AC
  Filled 2015-04-02: qty 10

## 2015-04-02 MED ORDER — OXYTOCIN 10 UNIT/ML IJ SOLN
INTRAMUSCULAR | Status: AC
  Filled 2015-04-02: qty 4

## 2015-04-02 MED ORDER — KETOROLAC TROMETHAMINE 30 MG/ML IJ SOLN
30.0000 mg | Freq: Four times a day (QID) | INTRAMUSCULAR | Status: AC | PRN
  Administered 2015-04-02: 30 mg via INTRAMUSCULAR

## 2015-04-02 MED ORDER — NALOXONE HCL 1 MG/ML IJ SOLN
1.0000 ug/kg/h | INTRAVENOUS | Status: DC | PRN
  Filled 2015-04-02: qty 2

## 2015-04-02 MED ORDER — NALOXONE HCL 0.4 MG/ML IJ SOLN: 0.4000 mg | INTRAMUSCULAR | Status: DC | PRN

## 2015-04-02 MED ORDER — NALBUPHINE HCL 10 MG/ML IJ SOLN
5.0000 mg | INTRAMUSCULAR | Status: DC | PRN
  Administered 2015-04-02: 10 mg via SUBCUTANEOUS

## 2015-04-02 MED ORDER — ACETAMINOPHEN 325 MG PO TABS
650.0000 mg | ORAL_TABLET | ORAL | Status: DC | PRN
  Administered 2015-04-02: 650 mg via ORAL
  Filled 2015-04-02: qty 2

## 2015-04-02 MED ORDER — PHENYLEPHRINE 8 MG IN D5W 100 ML (0.08MG/ML) PREMIX OPTIME
INJECTION | INTRAVENOUS | Status: AC
  Filled 2015-04-02: qty 100

## 2015-04-02 MED ORDER — LACTATED RINGERS IV SOLN
INTRAVENOUS | Status: DC | PRN
  Administered 2015-04-02: 11:00:00 via INTRAVENOUS

## 2015-04-02 MED ORDER — ONDANSETRON HCL 4 MG/2ML IJ SOLN
INTRAMUSCULAR | Status: AC
  Filled 2015-04-02: qty 2

## 2015-04-02 MED ORDER — OXYCODONE-ACETAMINOPHEN 5-325 MG PO TABS
1.0000 | ORAL_TABLET | ORAL | Status: DC | PRN
  Administered 2015-04-03 (×2): 1 via ORAL
  Filled 2015-04-02 (×2): qty 1

## 2015-04-02 MED ORDER — IBUPROFEN 600 MG PO TABS: 600.0000 mg | ORAL_TABLET | Freq: Four times a day (QID) | ORAL | Status: DC | PRN

## 2015-04-02 MED ORDER — PROMETHAZINE HCL 25 MG/ML IJ SOLN
6.2500 mg | INTRAMUSCULAR | Status: DC | PRN
  Administered 2015-04-02: 12.5 mg via INTRAVENOUS

## 2015-04-02 MED ORDER — MORPHINE SULFATE (PF) 0.5 MG/ML IJ SOLN
INTRAMUSCULAR | Status: DC | PRN
  Administered 2015-04-02: .15 mg via INTRATHECAL

## 2015-04-02 MED ORDER — FENTANYL CITRATE (PF) 100 MCG/2ML IJ SOLN: 25.0000 ug | INTRAMUSCULAR | Status: DC | PRN

## 2015-04-02 MED ORDER — BUPIVACAINE LIPOSOME 1.3 % IJ SUSP
20.0000 mL | Freq: Once | INTRAMUSCULAR | Status: DC
  Filled 2015-04-02: qty 20

## 2015-04-02 MED ORDER — BUPIVACAINE HCL (PF) 0.25 % IJ SOLN
INTRAMUSCULAR | Status: AC
  Filled 2015-04-02: qty 30

## 2015-04-02 MED ORDER — SIMETHICONE 80 MG PO CHEW
80.0000 mg | CHEWABLE_TABLET | Freq: Three times a day (TID) | ORAL | Status: DC
Start: 2015-04-02 — End: 2015-04-04
  Administered 2015-04-02 – 2015-04-04 (×4): 80 mg via ORAL
  Filled 2015-04-02 (×4): qty 1

## 2015-04-02 MED ORDER — IBUPROFEN 600 MG PO TABS
600.0000 mg | ORAL_TABLET | Freq: Four times a day (QID) | ORAL | Status: DC
  Administered 2015-04-02 – 2015-04-04 (×8): 600 mg via ORAL
  Filled 2015-04-02 (×8): qty 1

## 2015-04-02 MED ORDER — LANOLIN HYDROUS EX OINT: 1.0000 "application " | TOPICAL_OINTMENT | CUTANEOUS | Status: DC | PRN

## 2015-04-02 MED ORDER — OXYTOCIN 40 UNITS IN LACTATED RINGERS INFUSION - SIMPLE MED: 62.5000 mL/h | INTRAVENOUS | Status: AC

## 2015-04-02 MED ORDER — SODIUM CHLORIDE 0.9 % IJ SOLN: 3.0000 mL | INTRAMUSCULAR | Status: DC | PRN

## 2015-04-02 MED ORDER — DEXAMETHASONE SODIUM PHOSPHATE 10 MG/ML IJ SOLN
INTRAMUSCULAR | Status: DC | PRN
  Administered 2015-04-02: 4 mg via INTRAVENOUS

## 2015-04-02 MED ORDER — SCOPOLAMINE 1 MG/3DAYS TD PT72
MEDICATED_PATCH | TRANSDERMAL | Status: AC
  Administered 2015-04-02: 1.5 mg via TRANSDERMAL
  Filled 2015-04-02: qty 1

## 2015-04-02 MED ORDER — ZOLPIDEM TARTRATE 5 MG PO TABS: 5.0000 mg | ORAL_TABLET | Freq: Every evening | ORAL | Status: DC | PRN

## 2015-04-02 MED ORDER — PNEUMOCOCCAL VAC POLYVALENT 25 MCG/0.5ML IJ INJ: 0.5000 mL | INJECTION | INTRAMUSCULAR | Status: DC

## 2015-04-02 MED ORDER — MENTHOL 3 MG MT LOZG: 1.0000 | LOZENGE | OROMUCOSAL | Status: DC | PRN

## 2015-04-02 MED ORDER — NALBUPHINE HCL 10 MG/ML IJ SOLN
5.0000 mg | INTRAMUSCULAR | Status: DC | PRN
  Administered 2015-04-02 – 2015-04-03 (×3): 5 mg via INTRAVENOUS
  Filled 2015-04-02 (×2): qty 1

## 2015-04-02 MED ORDER — DIPHENHYDRAMINE HCL 50 MG/ML IJ SOLN
12.5000 mg | INTRAMUSCULAR | Status: DC | PRN
  Administered 2015-04-02 (×2): 12.5 mg via INTRAVENOUS
  Filled 2015-04-02 (×2): qty 1

## 2015-04-02 MED ORDER — FENTANYL CITRATE (PF) 100 MCG/2ML IJ SOLN
INTRAMUSCULAR | Status: DC | PRN
  Administered 2015-04-02: 25 ug via INTRATHECAL

## 2015-04-02 MED ORDER — ONDANSETRON HCL 4 MG/2ML IJ SOLN: 4.0000 mg | Freq: Three times a day (TID) | INTRAMUSCULAR | Status: DC | PRN

## 2015-04-02 SURGICAL SUPPLY — 34 items
BENZOIN TINCTURE PRP APPL 2/3 (GAUZE/BANDAGES/DRESSINGS) ×3 IMPLANT
CATH ROBINSON RED A/P 16FR (CATHETERS) ×3 IMPLANT
CLAMP CORD UMBIL (MISCELLANEOUS) ×3 IMPLANT
CLOSURE WOUND 1/2 X4 (GAUZE/BANDAGES/DRESSINGS) ×1
CLOTH BEACON ORANGE TIMEOUT ST (SAFETY) ×3 IMPLANT
DRAPE SHEET LG 3/4 BI-LAMINATE (DRAPES) ×3 IMPLANT
DRSG OPSITE POSTOP 4X10 (GAUZE/BANDAGES/DRESSINGS) ×3 IMPLANT
DURAPREP 26ML APPLICATOR (WOUND CARE) ×3 IMPLANT
ELECT REM PT RETURN 9FT ADLT (ELECTROSURGICAL) ×3
ELECTRODE REM PT RTRN 9FT ADLT (ELECTROSURGICAL) ×1 IMPLANT
EXTRACTOR VACUUM M CUP 4 TUBE (SUCTIONS) IMPLANT
EXTRACTOR VACUUM M CUP 4' TUBE (SUCTIONS)
GLOVE BIOGEL PI IND STRL 7.5 (GLOVE) ×2 IMPLANT
GLOVE BIOGEL PI INDICATOR 7.5 (GLOVE) ×4
GLOVE ECLIPSE 7.5 STRL STRAW (GLOVE) ×3 IMPLANT
GOWN STRL REUS W/TWL LRG LVL3 (GOWN DISPOSABLE) ×9 IMPLANT
KIT ABG SYR 3ML LUER SLIP (SYRINGE) IMPLANT
NEEDLE HYPO 22GX1.5 SAFETY (NEEDLE) ×3 IMPLANT
NEEDLE HYPO 25X5/8 SAFETYGLIDE (NEEDLE) IMPLANT
NS IRRIG 1000ML POUR BTL (IV SOLUTION) ×3 IMPLANT
PACK C SECTION WH (CUSTOM PROCEDURE TRAY) ×3 IMPLANT
PAD OB MATERNITY 4.3X12.25 (PERSONAL CARE ITEMS) ×3 IMPLANT
RTRCTR C-SECT PINK 25CM LRG (MISCELLANEOUS) ×3 IMPLANT
STRIP CLOSURE SKIN 1/2X4 (GAUZE/BANDAGES/DRESSINGS) ×2 IMPLANT
SUT MNCRL 0 VIOLET CTX 36 (SUTURE) IMPLANT
SUT MONOCRYL 0 CTX 36 (SUTURE)
SUT VIC AB 0 CTX 36 (SUTURE) ×6
SUT VIC AB 0 CTX36XBRD ANBCTRL (SUTURE) ×3 IMPLANT
SUT VIC AB 2-0 CT1 27 (SUTURE) ×2
SUT VIC AB 2-0 CT1 TAPERPNT 27 (SUTURE) ×1 IMPLANT
SUT VIC AB 4-0 KS 27 (SUTURE) ×3 IMPLANT
SYR 30ML LL (SYRINGE) ×3 IMPLANT
TOWEL OR 17X24 6PK STRL BLUE (TOWEL DISPOSABLE) ×3 IMPLANT
TRAY FOLEY CATH SILVER 14FR (SET/KITS/TRAYS/PACK) ×3 IMPLANT

## 2015-04-02 NOTE — Op Note (Signed)
Laura Austin PROCEDURE DATE: 04/02/2015  PREOPERATIVE DIAGNOSIS: Intrauterine pregnancy at  1387w0d weeks gestation; previous uterine incision kerr x2  POSTOPERATIVE DIAGNOSIS: The same  PROCEDURE: Repeat Low Transverse Cesarean Section  SURGEON:  Dr. Candelaria CelesteJacob Querida Beretta  ASSISTANT: Dr Delanna AhmadiLawler, PGY-2  INDICATIONS: Laura Austin is a 24 y.o. [id number] at 2787w0d scheduled for cesarean section secondary to previous uterine incision kerr x2.  The risks of cesarean section discussed with the patient included but were not limited to: bleeding which may require transfusion or reoperation; infection which may require antibiotics; injury to bowel, bladder, ureters or other surrounding organs; injury to the fetus; need for additional procedures including hysterectomy in the event of a life-threatening hemorrhage; placental abnormalities wth subsequent pregnancies, incisional problems, thromboembolic phenomenon and other postoperative/anesthesia complications. The patient concurred with the proposed plan, giving informed written consent for the procedure.    FINDINGS:  Viable female infant in cephalic presentation.  Apgars 8 and 9, weight, 7 pounds and 5 ounces.  Clear amniotic fluid.  Intact placenta, three vessel cord.  Normal uterus, fallopian tubes and ovaries bilaterally.  ANESTHESIA:    Spinal INTRAVENOUS FLUIDS:1400 ml ESTIMATED BLOOD LOSS: 900 ml URINE OUTPUT:  100 ml SPECIMENS: Placenta sent to L&D COMPLICATIONS: None immediate  PROCEDURE IN DETAIL:  The patient received intravenous antibiotics and had sequential compression devices applied to her lower extremities while in the preoperative area.  She was then taken to the operating room where spinal anesthesia was administered and was found to be adequate. She was then placed in a dorsal supine position with a leftward tilt, and prepped and draped in a sterile manner.  A foley catheter was placed into her bladder and attached to constant gravity, which  drained clear fluid throughout.  A Traxi pannus retractor was placed for good visualization of the incision area.  After an adequate timeout was performed, a Pfannenstiel skin incision was made with scalpel and carried through to the underlying layer of fascia. The fascia was incised in the midline and this incision was extended bilaterally using the Mayo scissors. The rectus muscles were densely adhered to the fascia.  Kocher clamps were applied to the superior aspect of the fascial incision and the underlying rectus muscles were dissected off bluntly. A similar process was carried out on the inferior aspect of the facial incision. The rectus muscles were separated in the midline bluntly and the peritoneum was entered bluntly. To aid in improved visualization, the rectus muscles were transected with Cautery about 2cm on either side.  An Alexis retractor was placed to aid in visualization of the uterus.  Attention was turned to the lower uterine segment where a transverse hysterotomy was made with a scalpel and extended bilaterally bluntly. The infant was successfully delivered, and cord was clamped and cut and infant was handed over to awaiting neonatology team. Uterine massage was then administered and the placenta delivered intact with three-vessel cord. The uterus was then cleared of clot and debris.  The hysterotomy was closed with 0 Vicryl in a running locked fashion, and an imbricating layer was also placed with a 0 Vicryl. Overall, excellent hemostasis was noted. The abdomen and the pelvis were cleared of all clot and debris and the Jon Gillslexis was removed. Hemostasis was confirmed on all surfaces.  The fascia was then closed using 0 Vicryl in a running fashion.  The skin was closed with 4-0 vicryl. A Pico wound vacuum devise was placed on the incision.  The patient tolerated the procedure well. Sponge, lap,  instrument and needle counts were correct x 2. She was taken to the recovery room in stable condition.     [name], DO 04/02/2015 11:49 AM

## 2015-04-02 NOTE — Anesthesia Procedure Notes (Signed)
Spinal Patient location during procedure: OR Start time: 04/02/2015 10:09 AM Staffing Anesthesiologist: Mal AmabileFOSTER, Trampus Mcquerry Performed by: anesthesiologist  Preanesthetic Checklist Completed: patient identified, site marked, surgical consent, pre-op evaluation, timeout performed, IV checked, risks and benefits discussed and monitors and equipment checked Spinal Block Patient position: sitting Prep: site prepped and draped and DuraPrep Patient monitoring: cardiac monitor, continuous pulse ox, blood pressure and heart rate Approach: midline Location: L3-4 Injection technique: catheter Needle Needle type: Tuohy and Sprotte  Needle gauge: 24 G Needle length: 12.7 cm Needle insertion depth: 9 cm Catheter type: closed end flexible Catheter size: 19 g Assessment Sensory level: T4 Additional Notes Epidural performed using 17 ga Touhy needle LOR with air. SAB performed through epidural needle. CSF clear, free flow, no heme, transient paresthesia right leg. Rx injected and spinal needle withdrawn.  Epidural catheter threaded 6 cm into epidural space and epidural needle then withdrawn and sterile dressing applied. Patient then placed supine with LUD. Patient tolerated procedure well. Adequate sensory level.

## 2015-04-02 NOTE — Anesthesia Postprocedure Evaluation (Signed)
Anesthesia Post Note  Patient: Laura Austin  Procedure(s) Performed: Procedure(s) (LRB): CESAREAN SECTION REPEAT (N/A)  Anesthesia type: Spinal  Patient location: PACU  Post pain: Pain level controlled  Post assessment: Post-op Vital signs reviewed  Last Vitals:  Filed Vitals:   04/02/15 1245  BP: 123/68  Pulse: 85  Temp:   Resp: 18    Post vital signs: Reviewed  Level of consciousness: awake  Complications: No apparent anesthesia complications

## 2015-04-02 NOTE — Transfer of Care (Signed)
Immediate Anesthesia Transfer of Care Note  Patient: Laura Austin  Procedure(s) Performed: Procedure(s): CESAREAN SECTION REPEAT (N/A)  Patient Location: PACU  Anesthesia Type:Spinal  Level of Consciousness: awake, alert  and oriented  Airway & Oxygen Therapy: Patient Spontanous Breathing  Post-op Assessment: Report given to RN and Post -op Vital signs reviewed and stable  Post vital signs: Reviewed and stable  Last Vitals:  Filed Vitals:   04/02/15 0850  BP: 149/82  Pulse: 99  Temp: 36.7 C  Resp: 18    Complications: No apparent anesthesia complications
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
## 2015-04-02 NOTE — Anesthesia Preprocedure Evaluation (Signed)
Anesthesia Evaluation  Patient identified by MRN, date of birth, ID band Patient awake    Reviewed: Allergy & Precautions, NPO status , Patient's Chart, lab work & pertinent test results  Airway Mallampati: III  TM Distance: >3 FB Neck ROM: Full    Dental no notable dental hx. (+) Teeth Intact   Pulmonary asthma , former smoker,  breath sounds clear to auscultation  Pulmonary exam normal       Cardiovascular hypertension, Normal cardiovascular examRhythm:Regular Rate:Normal     Neuro/Psych PSYCHIATRIC DISORDERS Depression Hx/o PTSDnegative neurological ROS     GI/Hepatic Neg liver ROS, GERD-  Medicated and Controlled,  Endo/Other  Morbid obesity  Renal/GU negative Renal ROS  negative genitourinary   Musculoskeletal negative musculoskeletal ROS (+)   Abdominal (+) + obese,   Peds  Hematology  (+) anemia ,   Anesthesia Other Findings   Reproductive/Obstetrics (+) Pregnancy Hx/o Pre eclampsia                             Anesthesia Physical Anesthesia Plan  ASA: III  Anesthesia Plan: Spinal and Combined Spinal and Epidural   Post-op Pain Management:    Induction:   Airway Management Planned: Natural Airway  Additional Equipment:   Intra-op Plan:   Post-operative Plan:   Informed Consent: I have reviewed the patients History and Physical, chart, labs and discussed the procedure including the risks, benefits and alternatives for the proposed anesthesia with the patient or authorized representative who has indicated his/her understanding and acceptance.   Dental advisory given  Plan Discussed with: Anesthesiologist, CRNA and Surgeon  Anesthesia Plan Comments:         Anesthesia Quick Evaluation

## 2015-04-02 NOTE — Lactation Note (Signed)
This note was copied from the chart of Boy Glendi Chuck. Lactation Consultation Note  Patient Name: Boy Ileana LaddLaquia Bier WRUEA'VToday's Date: 04/02/2015 Reason for consult: Initial assessment Mom reports this baby is latching well thus far. Baby asleep at this visit so did not see latch. Mom reports her 1st baby did not latch, her 2nd baby BF for 1 month, she BF/bottle fed. When she returned to work she bottle fed. Mom is pleased this baby is latching well and wants to be successful this time w/BF. Basic teaching reviewed with Mom. Lactation brochure left for review, advised of OP services and support group. Encouraged Mom to BF with feeding ques. Baby should be at the breast 8-12 times or more in 24 hours.  Encouraged to call for assist as needed with latch or for questions/concerns.   Maternal Data Has patient been taught Hand Expression?: Yes Does the patient have breastfeeding experience prior to this delivery?: Yes  Feeding Feeding Type: Breast Fed Length of feed: 30 min  LATCH Score/Interventions                      Lactation Tools Discussed/Used WIC Program: Yes   Consult Status Consult Status: Follow-up Date: 04/03/15 Follow-up type: In-patient    Alfred LevinsGranger, Caidence Higashi Ann 04/02/2015, 6:20 PM

## 2015-04-03 LAB — CBC
HEMATOCRIT: 29.3 % — AB (ref 36.0–46.0)
Hemoglobin: 9.6 g/dL — ABNORMAL LOW (ref 12.0–15.0)
MCH: 29 pg (ref 26.0–34.0)
MCHC: 32.8 g/dL (ref 30.0–36.0)
MCV: 88.5 fL (ref 78.0–100.0)
Platelets: 178 10*3/uL (ref 150–400)
RBC: 3.31 MIL/uL — ABNORMAL LOW (ref 3.87–5.11)
RDW: 14.2 % (ref 11.5–15.5)
WBC: 11.8 10*3/uL — ABNORMAL HIGH (ref 4.0–10.5)

## 2015-04-03 LAB — BIRTH TISSUE RECOVERY COLLECTION (PLACENTA DONATION)

## 2015-04-03 NOTE — Addendum Note (Signed)
Addendum  created 04/03/15 0810 by Angela Adamana G Tommy Goostree, CRNA   Modules edited: Notes Section   Notes Section:  File: [id number]
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
## 2015-04-03 NOTE — Anesthesia Postprocedure Evaluation (Signed)
  Anesthesia Post-op Note  Patient: Laura Austin  Procedure(s) Performed: Procedure(s): CESAREAN SECTION REPEAT (N/A)  Patient Location: Mother/Baby  Anesthesia Type:Epidural  Level of Consciousness: awake and alert   Airway and Oxygen Therapy: Patient Spontanous Breathing  Post-op Pain: mild  Post-op Assessment: Post-op Vital signs reviewed, Patient's Cardiovascular Status Stable, Respiratory Function Stable, No signs of Nausea or vomiting, Pain level controlled, No headache, No residual numbness and No residual motor weakness  Post-op Vital Signs: Reviewed and stable  Last Vitals:  Filed Vitals:   04/03/15 0504  BP: 121/64  Pulse: 88  Temp: 36.8 C  Resp: 20    Complications: No apparent anesthesia complications

## 2015-04-03 NOTE — Lactation Note (Signed)
This note was copied from the chart of Laura Austin. Lactation Consultation Note Mom having difficulty latching baby. Mom has LARGE pendulum breast w/nipple at the bottom of breast w/nipple pointing down towards body. Very soft, w/hand expression can get colostrum. Elevated breast on pillow and in football position latched baby w/#20NS. Difficulty for NS to stay on. Also has #24. Nipple being at bottom end of breast w/the curve doesn't get a good seal. Mom has everted nipple w/stimulation w/center of nipple inverted. Discussed options for BF positions. Discussed plan w/mom and RN. Put a DEBP in rm. RN will set up. Encouraged mom to post pump every 2-3 hours and give colostrum. Mom is breast/bottle and will supllment as well w/formula. Explained supply and demand and supplementing w/formula will decrease milk supply if not stimulated breast.  Patient Name: Laura Ileana LaddLaquia Frost ZHYQM'VToday's Date: 04/03/2015 Reason for consult: Follow-up assessment;Difficult latch   Maternal Data    Feeding Feeding Type: Breast Fed  LATCH Score/Interventions Latch: Repeated attempts needed to sustain latch, nipple held in mouth throughout feeding, stimulation needed to elicit sucking reflex. Intervention(s): Adjust position;Assist with latch;Breast massage;Breast compression  Audible Swallowing: None Intervention(s): Hand expression Intervention(s): Alternate breast massage  Type of Nipple: Everted at rest and after stimulation (inverted in center)  Comfort (Breast/Nipple): Filling, red/small blisters or bruises, mild/mod discomfort  Problem noted: Mild/Moderate discomfort Interventions (Mild/moderate discomfort): Post-pump;Breast shields;Hand massage;Hand expression  Hold (Positioning): Assistance needed to correctly position infant at breast and maintain latch. Intervention(s): Breastfeeding basics reviewed;Support Pillows;Position options;Skin to skin  LATCH Score: 5  Lactation Tools  Discussed/Used Tools: Nipple Dorris CarnesShields;Pump Nipple shield size: 20;24 Breast pump type: Double-Electric Breast Pump Initiated by:: Peri JeffersonL. Eleazar Kimmey RN/RN Date initiated:: 04/03/15   Consult Status Consult Status: Follow-up Date: 04/04/15 Follow-up type: In-patient    Charyl DancerCARVER, Sonora Catlin G 04/03/2015, 5:14 PM

## 2015-04-03 NOTE — Progress Notes (Signed)
Subjective: Postpartum Day 1: Cesarean Delivery Patient reports incisional pain and tolerating PO.    Objective: Vital signs in last 24 hours: Temp:  [97.8 F (36.6 C)-98.5 F (36.9 C)] 98.3 F (36.8 C) (06/04 0504) Pulse Rate:  [69-99] 88 (06/04 0504) Resp:  [13-30] 20 (06/04 0504) BP: (109-149)/(51-82) 121/64 mmHg (06/04 0504) SpO2:  [95 %-100 %] 95 % (06/04 0504)  Physical Exam:  General: alert, cooperative and no distress Lochia: appropriate Uterine Fundus: firm Incision: healing well, no significant drainage, Drain functioning well DVT Evaluation: No evidence of DVT seen on physical exam.   Recent Labs  04/01/15 1200 04/03/15 0610  HGB 10.9* 9.6*  HCT 33.8* 29.3*    Assessment/Plan: Status post Cesarean section. Doing well postoperatively.  Continue current care.  Laura Austin,Laura Austin 04/03/2015, 6:21 AM

## 2015-04-04 LAB — TYPE AND SCREEN
ABO/RH(D): A POS
Antibody Screen: NEGATIVE
UNIT DIVISION: 0
Unit division: 0

## 2015-04-04 MED ORDER — OXYCODONE-ACETAMINOPHEN 5-325 MG PO TABS: 1.0000 | ORAL_TABLET | ORAL | Status: DC | PRN

## 2015-04-04 NOTE — Progress Notes (Signed)
Subjective: Postpartum Day 2: Cesarean Delivery Eating, drinking, voiding, ambulating well.  +flatus, +BM.  Lochia and pain wnl.  Denies dizziness, lightheadedness, or sob. Having some difficulty w/ breastfeeding, would like to stay another day to have LC.   Objective: Vital signs in last 24 hours: Temp:  [98 F (36.7 C)-98.7 F (37.1 C)] 98 F (36.7 C) (06/05 0545) Pulse Rate:  [56-87] 56 (06/05 0545) Resp:  [20] 20 (06/05 0545) BP: (107-127)/(59-64) 107/63 mmHg (06/05 0545) SpO2:  [99 %] 99 % (06/04 0800)  Physical Exam:  General: alert, cooperative and no distress Lochia: appropriate Uterine Fundus: firm Incision: healing well, no significant drainage, no dehiscence, no significant erythema- PICO wound vac in place DVT Evaluation: No evidence of DVT seen on physical exam. Negative Homan's sign. No cords or calf tenderness. No significant calf/ankle edema.   Recent Labs  04/01/15 1200 04/03/15 0610  HGB 10.9* 9.6*  HCT 33.8* 29.3*    Assessment/Plan: Status post Cesarean section. Doing well postoperatively.  Continue current care. Plan for d/c tomorrow as she [facility] Thinking about nexplanon vs. 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

## 2015-04-04 NOTE — Discharge Summary (Signed)
Obstetric Discharge Summary Reason for Admission: cesarean section Prenatal Procedures: none Intrapartum Procedures: cesarean: low cervical, transverse Postpartum Procedures: none Complications-Operative and Postpartum: none HEMOGLOBIN  Date Value Ref Range Status  04/03/2015 9.6* 12.0 - 15.0 g/dL Final   HCT  Date Value Ref Range Status  04/03/2015 29.3* 36.0 - 46.0 % Final   Ms. Laura Austin is a 24 y.o. [id number] who is discharge after repeat LTCS at 7841w0d. She did well in post partum period. Eating, drinking, voiding, ambulating well. +flatus, +BM. Lochia and pain wnl.Patient worked with Advertising copywriterlactation consultant during admission due to some difficulty with breastfeeding; improving at discharge. Patient is considering nexplanon vs Depo for postpartum contraception. Advised on pelvic rest.   Physical Exam:  Filed Vitals:   04/04/15 0545  BP: 107/63  Pulse: 56  Temp: 98 F (36.7 C)  Resp: 20    General: alert Lochia: appropriate Uterine Fundus: firm Incision: healing well, no significant drainage, no significant erythema DVT Evaluation: No cords or calf tenderness. No significant calf/ankle edema.  Discharge Diagnoses: Term Pregnancy-delivered and s/p repeat LTCS  Discharge Information: Date: 04/04/2015 Activity: pelvic rest Diet: routine Medications: Percocet Condition: stable Instructions: refer to practice specific booklet Discharge to: home Follow-up Information    [facility]   Specialty:  Obstetrics and Gynecology   Why:  4-6 weeks for post partum visit   Contact information:   [address] [place] [id number] [phone]      Newborn Data: Live born female  Birth Weight: 7 lb 5.1 oz (3320 g) APGAR: 8, 9  Home with self.  Fabio AsaMonica T Lawler 04/04/2015, 10:52 AM   I spoke with and examined patient and agree with resident/PA/SNM's note and plan of care.  Cheral MarkerKimberly R. Galileo Colello, CNM, [facility]NP-BC 04/05/2015 8:47 AM

## 2015-04-04 NOTE — Discharge Instructions (Signed)
Breastfeeding °Deciding to breastfeed is one of the best choices you can make for you and your baby. A change in hormones during pregnancy causes your breast tissue to grow and increases the number and size of your milk ducts. These hormones also allow proteins, sugars, and fats from your blood supply to make breast milk in your milk-producing glands. Hormones prevent breast milk from being released before your baby is born as well as prompt milk flow after birth. Once breastfeeding has begun, thoughts of your baby, as well as his or her sucking or crying, can stimulate the release of milk from your milk-producing glands.  °BENEFITS OF BREASTFEEDING °For Your Baby °· Your first milk (colostrum) helps your baby's digestive system function better.   °· There are antibodies in your milk that help your baby fight off infections.   °· Your baby has a lower incidence of asthma, allergies, and sudden infant death syndrome.   °· The nutrients in breast milk are better for your baby than infant formulas and are designed uniquely for your baby's needs.   °· Breast milk improves your baby's brain development.   °· Your baby is less likely to develop other conditions, such as childhood obesity, asthma, or type 2 diabetes mellitus.   °For You  °· Breastfeeding helps to create a very special bond between you and your baby.   °· Breastfeeding is convenient. Breast milk is always available at the correct temperature and costs nothing.   °· Breastfeeding helps to burn calories and helps you lose the weight gained during pregnancy.   °· Breastfeeding makes your uterus contract to its prepregnancy size faster and slows bleeding (lochia) after you give birth.   °· Breastfeeding helps to lower your risk of developing type 2 diabetes mellitus, osteoporosis, and breast or ovarian cancer later in life. °SIGNS THAT YOUR BABY IS HUNGRY °Early Signs of Hunger  °· Increased alertness or activity. °· Stretching. °· Movement of the head from  side to side. °· Movement of the head and opening of the mouth when the corner of the mouth or cheek is stroked (rooting). °· Increased sucking sounds, smacking lips, cooing, sighing, or squeaking. °· Hand-to-mouth movements. °· Increased sucking of fingers or hands. °Late Signs of Hunger °· Fussing. °· Intermittent crying. °Extreme Signs of Hunger °Signs of extreme hunger will require calming and consoling before your baby will be able to breastfeed successfully. Do not wait for the following signs of extreme hunger to occur before you initiate breastfeeding:   °· Restlessness. °· A loud, strong cry. °·  Screaming. °BREASTFEEDING BASICS °Breastfeeding Initiation °· Find a comfortable place to sit or lie down, with your neck and back well supported. °· Place a pillow or rolled up blanket under your baby to bring him or her to the level of your breast (if you are seated). Nursing pillows are specially designed to help support your arms and your baby while you breastfeed. °· Make sure that your baby's abdomen is facing your abdomen.   °· Gently massage your breast. With your fingertips, massage from your chest wall toward your nipple in a circular motion. This encourages milk flow. You may need to continue this action during the feeding if your milk flows slowly. °· Support your breast with 4 fingers underneath and your thumb above your nipple. Make sure your fingers are well away from your nipple and your baby's mouth.   °· Stroke your baby's lips gently with your finger or nipple.   °· When your baby's mouth is open wide enough, quickly bring your baby to your   breast, placing your entire nipple and as much of the colored area around your nipple (areola) as possible into your baby's mouth.   °¨ More areola should be visible above your baby's upper lip than below the lower lip.   °¨ Your baby's tongue should be between his or her lower gum and your breast.   °· Ensure that your baby's mouth is correctly positioned  around your nipple (latched). Your baby's lips should create a seal on your breast and be turned out (everted). °· It is common for your baby to suck about 2-3 minutes in order to start the flow of breast milk. °Latching °Teaching your baby how to latch on to your breast properly is very important. An improper latch can cause nipple pain and decreased milk supply for you and poor weight gain in your baby. Also, if your baby is not latched onto your nipple properly, he or she may swallow some air during feeding. This can make your baby fussy. Burping your baby when you switch breasts during the feeding can help to get rid of the air. However, teaching your baby to latch on properly is still the best way to prevent fussiness from swallowing air while breastfeeding. °Signs that your baby has successfully latched on to your nipple:    °· Silent tugging or silent sucking, without causing you pain.   °· Swallowing heard between every 3-4 sucks.   °·  Muscle movement above and in front of his or her ears while sucking.   °Signs that your baby has not successfully latched on to nipple:  °· Sucking sounds or smacking sounds from your baby while breastfeeding. °· Nipple pain. °If you think your baby has not latched on correctly, slip your finger into the corner of your baby's mouth to break the suction and place it between your baby's gums. Attempt breastfeeding initiation again. °Signs of Successful Breastfeeding °Signs from your baby:   °· A gradual decrease in the number of sucks or complete cessation of sucking.   °· Falling asleep.   °· Relaxation of his or her body.   °· Retention of a small amount of milk in his or her mouth.   °· Letting go of your breast by himself or herself. °Signs from you: °· Breasts that have increased in firmness, weight, and size 1-3 hours after feeding.   °· Breasts that are softer immediately after breastfeeding. °· Increased milk volume, as well as a change in milk consistency and color by  the fifth day of breastfeeding.   °· Nipples that are not sore, cracked, or bleeding. °Signs That Your Baby is Getting Enough Milk °· Wetting at least 3 diapers in a 24-hour period. The urine should be clear and pale yellow by age 5 days. °· At least 3 stools in a 24-hour period by age 5 days. The stool should be soft and yellow. °· At least 3 stools in a 24-hour period by age 7 days. The stool should be seedy and yellow. °· No loss of weight greater than 10% of birth weight during the first 3 days of age. °· Average weight gain of 4-7 ounces (113-198 g) per week after age 4 days. °· Consistent daily weight gain by age 5 days, without weight loss after the age of 2 weeks. °After a feeding, your baby may spit up a small amount. This is common. °BREASTFEEDING FREQUENCY AND DURATION °Frequent feeding will help you make more milk and can prevent sore nipples and breast engorgement. Breastfeed when you feel the need to reduce the fullness of your breasts   or when your baby shows signs of hunger. This is called "breastfeeding on demand." Avoid introducing a pacifier to your baby while you are working to establish breastfeeding (the first 4-6 weeks after your baby is born). After this time you may choose to use a pacifier. Research has shown that pacifier use during the first year of a baby's life decreases the risk of sudden infant death syndrome (SIDS). °Allow your baby to feed on each breast as long as he or she wants. Breastfeed until your baby is finished feeding. When your baby unlatches or falls asleep while feeding from the first breast, offer the second breast. Because newborns are often sleepy in the first few weeks of life, you may need to awaken your baby to get him or her to feed. °Breastfeeding times will vary from baby to baby. However, the following rules can serve as a guide to help you ensure that your baby is properly fed: °· Newborns (babies 4 weeks of age or younger) may breastfeed every 1-3  hours. °· Newborns should not go longer than 3 hours during the day or 5 hours during the night without breastfeeding. °· You should breastfeed your baby a minimum of 8 times in a 24-hour period until you begin to introduce solid foods to your baby at around 6 months of age. °BREAST MILK PUMPING °Pumping and storing breast milk allows you to ensure that your baby is exclusively fed your breast milk, even at times when you are unable to breastfeed. This is especially important if you are going back to work while you are still breastfeeding or when you are not able to be present during feedings. Your lactation consultant can give you guidelines on how long it is safe to store breast milk.  °A breast pump is a machine that allows you to pump milk from your breast into a sterile bottle. The pumped breast milk can then be stored in a refrigerator or freezer. Some breast pumps are operated by hand, while others use electricity. Ask your lactation consultant which type will work best for you. Breast pumps can be purchased, but some hospitals and breastfeeding support groups lease breast pumps on a monthly basis. A lactation consultant can teach you how to hand express breast milk, if you prefer not to use a pump.  °CARING FOR YOUR BREASTS WHILE YOU BREASTFEED °Nipples can become dry, cracked, and sore while breastfeeding. The following recommendations can help keep your breasts moisturized and healthy: °· Avoid using soap on your nipples.   °· Wear a supportive bra. Although not required, special nursing bras and tank tops are designed to allow access to your breasts for breastfeeding without taking off your entire bra or top. Avoid wearing underwire-style bras or extremely tight bras. °· Air dry your nipples for 3-4 minutes after each feeding.   °· Use only cotton bra pads to absorb leaked breast milk. Leaking of breast milk between feedings is normal.   °· Use lanolin on your nipples after breastfeeding. Lanolin helps to  maintain your skin's normal moisture barrier. If you use pure lanolin, you do not need to wash it off before feeding your baby again. Pure lanolin is not toxic to your baby. You may also hand express a few drops of breast milk and gently massage that milk into your nipples and allow the milk to air dry. °In the first few weeks after giving birth, some women experience extremely full breasts (engorgement). Engorgement can make your breasts feel heavy, warm, and tender to the   touch. Engorgement peaks within 3-5 days after you give birth. The following recommendations can help ease engorgement: °· Completely empty your breasts while breastfeeding or pumping. You may want to start by applying warm, moist heat (in the shower or with warm water-soaked hand towels) just before feeding or pumping. This increases circulation and helps the milk flow. If your baby does not completely empty your breasts while breastfeeding, pump any extra milk after he or she is finished. °· Wear a snug bra (nursing or regular) or tank top for 1-2 days to signal your body to slightly decrease milk production. °· Apply ice packs to your breasts, unless this is too uncomfortable for you. °· Make sure that your baby is latched on and positioned properly while breastfeeding. °If engorgement persists after 48 hours of following these recommendations, contact your health care provider or a lactation consultant. °OVERALL HEALTH CARE RECOMMENDATIONS WHILE BREASTFEEDING °· Eat healthy foods. Alternate between meals and snacks, eating 3 of each per day. Because what you eat affects your breast milk, some of the foods may make your baby more irritable than usual. Avoid eating these foods if you are sure that they are negatively affecting your baby. °· Drink milk, fruit juice, and water to satisfy your thirst (about 10 glasses a day).   °· Rest often, relax, and continue to take your prenatal vitamins to prevent fatigue, stress, and anemia. °· Continue  breast self-awareness checks. °· Avoid chewing and smoking tobacco. °· Avoid alcohol and drug use. °Some medicines that may be harmful to your baby can pass through breast milk. It is important to ask your health care provider before taking any medicine, including all over-the-counter and prescription medicine as well as vitamin and herbal supplements. °It is possible to become pregnant while breastfeeding. If birth control is desired, ask your health care provider about options that will be safe for your baby. °SEEK MEDICAL CARE IF:  °· You feel like you want to stop breastfeeding or have become frustrated with breastfeeding. °· You have painful breasts or nipples. °· Your nipples are cracked or bleeding. °· Your breasts are red, tender, or warm. °· You have a swollen area on either breast. °· You have a fever or chills. °· You have nausea or vomiting. °· You have drainage other than breast milk from your nipples. °· Your breasts do not become full before feedings by the fifth day after you give birth. °· You feel sad and depressed. °· Your baby is too sleepy to eat well. °· Your baby is having trouble sleeping.   °· Your baby is wetting less than 3 diapers in a 24-hour period. °· Your baby has less than 3 stools in a 24-hour period. °· Your baby's skin or the white part of his or her eyes becomes yellow.   °· Your baby is not gaining weight by 5 days of age. °SEEK IMMEDIATE MEDICAL CARE IF:  °· Your baby is overly tired (lethargic) and does not want to wake up and feed. °· Your baby develops an unexplained fever. °Document Released: 10/16/2005 Document Revised: 10/21/2013 Document Reviewed: 04/09/2013 °[employer]® Patient Information ©2015 [employer]. This information is not intended to replace advice given to you by your health care provider. Make sure you discuss any questions you have with your health care provider. °Cesarean Delivery, Care After °Refer to this sheet in the next few weeks. These instructions  provide you with information on caring for yourself after your procedure. Your health care provider may also give you   specific instructions. Your treatment has been planned according to current medical practices, but problems sometimes occur. Call your health care provider if you have any problems or questions after you go home. °HOME CARE INSTRUCTIONS  °· Only take over-the-counter or prescription medications as directed by your health care provider. °· Do not drink alcohol, especially if you are breastfeeding or taking medication to relieve pain. °· Do not chew or smoke tobacco. °· Continue to use good perineal care. Good perineal care includes: °· Wiping your perineum from front to back. °· Keeping your perineum clean. °· Check your surgical cut (incision) daily for increased redness, drainage, swelling, or separation of skin. °· Clean your incision gently with soap and water every day, and then pat it dry. If your health care provider says it is okay, leave the incision uncovered. Use a bandage (dressing) if the incision is draining fluid or appears irritated. If the adhesive strips across the incision do not fall off within 7 days, carefully peel them off. °· Hug a pillow when coughing or sneezing until your incision is healed. This helps to relieve pain. °· Do not use tampons or douche until your health care provider says it is okay. °· Shower, wash your hair, and take tub baths as directed by your health care provider. °· Wear a well-fitting bra that provides breast support. °· Limit wearing support panties or control-top hose. °· Drink enough fluids to keep your urine clear or pale yellow. °· Eat high-fiber foods such as whole grain cereals and breads, brown rice, beans, and fresh fruits and vegetables every day. These foods may help prevent or relieve constipation. °· Resume activities such as climbing stairs, driving, lifting, exercising, or traveling as directed by your health care provider. °· Talk to  your health care provider about resuming sexual activities. This is dependent upon your risk of infection, your rate of healing, and your comfort and desire to resume sexual activity. °· Try to have someone help you with your household activities and your newborn for at least a few days after you leave the hospital. °· Rest as much as possible. Try to rest or take a nap when your newborn is sleeping. °· Increase your activities gradually. °· Keep all of your scheduled postpartum appointments. It is very important to keep your scheduled follow-up appointments. At these appointments, your health care provider will be checking to make sure that you are healing physically and emotionally. °SEEK MEDICAL CARE IF:  °· You are passing large clots from your vagina. Save any clots to show your health care provider. °· You have a foul smelling discharge from your vagina. °· You have trouble urinating. °· You are urinating frequently. °· You have pain when you urinate. °· You have a change in your bowel movements. °· You have increasing redness, pain, or swelling near your incision. °· You have pus draining from your incision. °· Your incision is separating. °· You have painful, hard, or reddened breasts. °· You have a severe headache. °· You have blurred vision or see spots. °· You feel sad or depressed. °· You have thoughts of hurting yourself or your newborn. °· You have questions about your care, the care of your newborn, or medications. °· You are dizzy or light-headed. °· You have a rash. °· You have pain, redness, or swelling at the site of the removed intravenous access (IV) tube. °· You have nausea or vomiting. °· You stopped breastfeeding and have not had a menstrual period within 12   weeks of stopping. °· You are not breastfeeding and have not had a menstrual period within 12 weeks of delivery. °· You have a fever. °SEEK IMMEDIATE MEDICAL CARE IF: °· You have persistent pain. °· You have chest pain. °· You have  shortness of breath. °· You faint. °· You have leg pain. °· You have stomach pain. °· Your vaginal bleeding saturates 2 or more sanitary pads in 1 hour. °MAKE SURE YOU:  °· Understand these instructions. °· Will watch your condition. °· Will get help right away if you are not doing well or get worse. °Document Released: 07/08/2002 Document Revised: 03/02/2014 Document Reviewed: 06/12/2012 °[employer]® Patient Information ©2015 [employer]. This information is not intended to replace advice given to you by your health care provider. Make sure you discuss any questions you have with your health care provider. ° °

## 2015-04-05 ENCOUNTER — Encounter (HOSPITAL_COMMUNITY): Payer: Self-pay | Admitting: Family Medicine

## 2015-04-05 NOTE — Progress Notes (Signed)
Post discharge chart review completed.  

## 2015-04-08 ENCOUNTER — Telehealth: Payer: Self-pay

## 2015-04-08 NOTE — Telephone Encounter (Signed)
Patient called stating the clear tubing of PICO drain has disconnected this morning-- has appointment tomorrow for removal-- but wants to know if there is anything she should do. Called patient and informed her that dressing will be removed tomorrow morning so not to worry about it. Patient verbalized understanding and gratitude. No further questions or concerns.

## 2015-04-09 ENCOUNTER — Ambulatory Visit (INDEPENDENT_AMBULATORY_CARE_PROVIDER_SITE_OTHER): Payer: Medicaid Other

## 2015-04-09 ENCOUNTER — Encounter (HOSPITAL_COMMUNITY): Payer: Self-pay | Admitting: *Deleted

## 2015-04-09 ENCOUNTER — Inpatient Hospital Stay (HOSPITAL_COMMUNITY)
Admission: AD | Admit: 2015-04-09 | Discharge: 2015-04-09 | Disposition: A | Discharge status: Home / Self Care | Payer: Medicaid Other | Source: Ambulatory Visit | Attending: Obstetrics & Gynecology | Admitting: Obstetrics & Gynecology

## 2015-04-09 VITALS — BP 157/105 | HR 89 | Temp 98.4°F | Wt 302.8 lb

## 2015-04-09 DIAGNOSIS — O165 Unspecified maternal hypertension, complicating the puerperium: Secondary | ICD-10-CM

## 2015-04-09 DIAGNOSIS — R519 Headache, unspecified: Secondary | ICD-10-CM

## 2015-04-09 DIAGNOSIS — R110 Nausea: Secondary | ICD-10-CM

## 2015-04-09 DIAGNOSIS — R030 Elevated blood-pressure reading, without diagnosis of hypertension: Secondary | ICD-10-CM

## 2015-04-09 DIAGNOSIS — I158 Other secondary hypertension: Secondary | ICD-10-CM | POA: Insufficient documentation

## 2015-04-09 DIAGNOSIS — R51 Headache: Secondary | ICD-10-CM | POA: Insufficient documentation

## 2015-04-09 DIAGNOSIS — Z9889 Other specified postprocedural states: Secondary | ICD-10-CM

## 2015-04-09 DIAGNOSIS — IMO0001 Reserved for inherently not codable concepts without codable children: Secondary | ICD-10-CM

## 2015-04-09 DIAGNOSIS — Z4889 Encounter for other specified surgical aftercare: Secondary | ICD-10-CM

## 2015-04-09 DIAGNOSIS — O9089 Other complications of the puerperium, not elsewhere classified: Secondary | ICD-10-CM | POA: Diagnosis not present

## 2015-04-09 DIAGNOSIS — Z98891 History of uterine scar from previous surgery: Secondary | ICD-10-CM

## 2015-04-09 HISTORY — DX: Obesity, unspecified: E66.9

## 2015-04-09 LAB — URINE MICROSCOPIC-ADD ON

## 2015-04-09 LAB — COMPREHENSIVE METABOLIC PANEL
ALK PHOS: 97 U/L (ref 38–126)
ALT: 12 U/L — AB (ref 14–54)
ANION GAP: 4 — AB (ref 5–15)
AST: 15 U/L (ref 15–41)
Albumin: 2.9 g/dL — ABNORMAL LOW (ref 3.5–5.0)
BILIRUBIN TOTAL: 0.6 mg/dL (ref 0.3–1.2)
BUN: 7 mg/dL (ref 6–20)
CALCIUM: 8.4 mg/dL — AB (ref 8.9–10.3)
CO2: 25 mmol/L (ref 22–32)
Chloride: 109 mmol/L (ref 101–111)
Creatinine, Ser: 0.61 mg/dL (ref 0.44–1.00)
GFR calc Af Amer: >60 mL/min (ref >60)
GLUCOSE: 81 mg/dL (ref 65–99)
POTASSIUM: 3.9 mmol/L (ref 3.5–5.1)
Sodium: 138 mmol/L (ref 135–145)
Total Protein: 6.4 g/dL — ABNORMAL LOW (ref 6.5–8.1)

## 2015-04-09 LAB — URINALYSIS, ROUTINE W REFLEX MICROSCOPIC
BILIRUBIN URINE: NEGATIVE
GLUCOSE, UA: NEGATIVE mg/dL
Ketones, ur: NEGATIVE mg/dL
Leukocytes, UA: NEGATIVE
NITRITE: NEGATIVE
Protein, ur: NEGATIVE mg/dL
Specific Gravity, Urine: 1.015 (ref 1.005–1.030)
Urobilinogen, UA: 1 mg/dL (ref 0.0–1.0)
pH: 7.5 (ref 5.0–8.0)

## 2015-04-09 LAB — CBC WITH DIFFERENTIAL/PLATELET
BASOS ABS: 0 10*3/uL (ref 0.0–0.1)
BASOS PCT: 0 % (ref 0–1)
Eosinophils Absolute: 0.3 10*3/uL (ref 0.0–0.7)
Eosinophils Relative: 4 % (ref 0–5)
HCT: 31.3 % — ABNORMAL LOW (ref 36.0–46.0)
Hemoglobin: 10.2 g/dL — ABNORMAL LOW (ref 12.0–15.0)
Lymphocytes Relative: 19 % (ref 12–46)
Lymphs Abs: 1.5 10*3/uL (ref 0.7–4.0)
MCH: 28.8 pg (ref 26.0–34.0)
MCHC: 32.6 g/dL (ref 30.0–36.0)
MCV: 88.4 fL (ref 78.0–100.0)
MONO ABS: 0.5 10*3/uL (ref 0.1–1.0)
Monocytes Relative: 7 % (ref 3–12)
Neutro Abs: 5.4 10*3/uL (ref 1.7–7.7)
Neutrophils Relative %: 70 % (ref 43–77)
OTHER: 0 %
PLATELETS: 260 10*3/uL (ref 150–400)
RBC: 3.54 MIL/uL — AB (ref 3.87–5.11)
RDW: 14 % (ref 11.5–15.5)
WBC: 7.7 10*3/uL (ref 4.0–10.5)

## 2015-04-09 LAB — PROTEIN / CREATININE RATIO, URINE
Creatinine, Urine: 69 mg/dL
Protein Creatinine Ratio: 0.16 mg/mg{Cre} — ABNORMAL HIGH (ref 0.00–0.15)
Total Protein, Urine: 11 mg/dL

## 2015-04-09 MED ORDER — DIPHENHYDRAMINE HCL 50 MG/ML IJ SOLN
25.0000 mg | Freq: Once | INTRAMUSCULAR | Status: AC
  Administered 2015-04-09: 25 mg via INTRAVENOUS
  Filled 2015-04-09: qty 1

## 2015-04-09 MED ORDER — METOCLOPRAMIDE HCL 5 MG/ML IJ SOLN
10.0000 mg | Freq: Once | INTRAMUSCULAR | Status: AC
  Administered 2015-04-09: 10 mg via INTRAVENOUS
  Filled 2015-04-09: qty 2

## 2015-04-09 MED ORDER — ACETAMINOPHEN 500 MG PO TABS
1000.0000 mg | ORAL_TABLET | ORAL | Status: AC
  Administered 2015-04-09: 1000 mg via ORAL
  Filled 2015-04-09: qty 2

## 2015-04-09 MED ORDER — DEXAMETHASONE SODIUM PHOSPHATE 10 MG/ML IJ SOLN: 10.0000 mg | Freq: Once | INTRAMUSCULAR | Status: DC

## 2015-04-09 MED ORDER — HYDROCHLOROTHIAZIDE 25 MG PO TABS: 25.0000 mg | ORAL_TABLET | Freq: Every day | ORAL | Status: DC

## 2015-04-09 MED ORDER — SODIUM CHLORIDE 0.9 % IV BOLUS (SEPSIS)
1000.0000 mL | Freq: Once | INTRAVENOUS | Status: AC
  Administered 2015-04-09: 1000 mL via INTRAVENOUS

## 2015-04-09 MED ORDER — DEXAMETHASONE SODIUM PHOSPHATE 10 MG/ML IJ SOLN
10.0000 mg | Freq: Once | INTRAMUSCULAR | Status: AC
  Administered 2015-04-09: 10 mg via INTRAVENOUS
  Filled 2015-04-09: qty 1

## 2015-04-09 MED ORDER — PROMETHAZINE HCL 25 MG PO TABS: 25.0000 mg | ORAL_TABLET | Freq: Four times a day (QID) | ORAL | Status: DC | PRN

## 2015-04-09 MED ORDER — AMLODIPINE BESYLATE 5 MG PO TABS: 5.0000 mg | ORAL_TABLET | Freq: Every day | ORAL | Status: DC

## 2015-04-09 NOTE — Discharge Instructions (Signed)

## 2015-04-09 NOTE — MAU Note (Signed)
Sent up from clinic.  C/s on 06/03, here for woundvac removal; BP elevated.  Pt has hx of pre- eclampsia.  C/o HA today, denies visual changes or epigastric pain, + swelling below the knees.
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
## 2015-04-09 NOTE — Progress Notes (Signed)
Pt here for removal of wound vac.  Wound vac dressing removed.  Pt tolerated well.  Incision well approximated with no drainage or odor.  Cleansed with NS and applied one steri-strip.  Advised pt to continue to allow warm, soapy water to run over incision and then pat dry.  I informed pt of signs of infections.  Pt BP elevated today, 186/123 and 157/105, with hx of pre-eclampsia.  Pt stated that she has been having a lot of headaches but no blurred vision.  Notified Dr. Macon Large, pt will be sent to MAU for evaluation and possible admission for postpartum preeclampsia.  MAU and on call team notified.   Faythe Casa, LPN  Agree with nurses's documentation of this patient's clinic encounter.  Tereso Newcomer, MD

## 2015-04-14 ENCOUNTER — Encounter: Payer: Self-pay | Admitting: Obstetrics & Gynecology

## 2015-04-16 ENCOUNTER — Ambulatory Visit: Payer: Medicaid Other | Admitting: Family Medicine

## 2015-05-07 ENCOUNTER — Encounter: Payer: Self-pay | Admitting: Obstetrics & Gynecology

## 2015-05-07 ENCOUNTER — Ambulatory Visit (INDEPENDENT_AMBULATORY_CARE_PROVIDER_SITE_OTHER): Payer: Medicaid Other | Admitting: Obstetrics & Gynecology

## 2015-05-07 DIAGNOSIS — Z3042 Encounter for surveillance of injectable contraceptive: Secondary | ICD-10-CM

## 2015-05-07 DIAGNOSIS — K429 Umbilical hernia without obstruction or gangrene: Secondary | ICD-10-CM | POA: Diagnosis not present

## 2015-05-07 LAB — POCT PREGNANCY, URINE: PREG TEST UR: NEGATIVE

## 2015-05-07 MED ORDER — MEDROXYPROGESTERONE ACETATE 150 MG/ML IM SUSP
150.0000 mg | Freq: Once | INTRAMUSCULAR | Status: AC
  Administered 2015-05-07: 150 mg via INTRAMUSCULAR

## 2015-05-07 NOTE — Patient Instructions (Signed)

## 2015-05-07 NOTE — Progress Notes (Signed)
Subjective:     Laura Austin is a 24 y.o. female who presents for a postpartum visit. She is 5 weeks postpartum following a low cervical transverse Cesarean section. I have fully reviewed the prenatal and intrapartum course. The delivery was at 39 gestational weeks. Outcome: repeat cesarean section, low transverse incision. Anesthesia: spinal. Postpartum course has been uncomplicated. Baby's course has been uncomplicated. Baby is feeding by bottle - Similac Alimentum. Bleeding changing a tampon every 3 hours. Bowel function is normal. Bladder function is normal. Patient is not sexually active. Contraception method is Depo-Provera injections. Postpartum depression screening: negative.  The following portions of the patient's history were reviewed and updated as appropriate: allergies, current medications, past family history, past medical history, past social history, past surgical history and problem list.  Review of Systems Pertinent items are noted in HPI.   Objective:  BP 152/54 mmHg  Pulse 93  Wt 290 lb 9.6 oz (131.815 kg)  LMP 05/05/2015 Pt in NAD Abd: obese, NT, ND.  Pt with a periumbilical hernia with a facial defect in 3.5cm. No evidence of incarceration   Incision clean and dry.         Assessment:     5 weeks postpartum exam. Pap smear not done at today's visit.     Plan:    1. Contraception: Depo Provera 150mg  IM 2. [facility] and management to periumbilical hernia   3. Follow up in: 3 months or as needed.

## 2015-05-09 ENCOUNTER — Other Ambulatory Visit: Payer: Self-pay | Admitting: Physician Assistant

## 2015-05-24 ENCOUNTER — Telehealth: Payer: Self-pay | Admitting: *Deleted

## 2015-05-24 NOTE — Telephone Encounter (Signed)
Received message from patient on nurse line left 05/24/15 at 1128.  Patient states she needs a referral to a surgeon for her hernia.  Requests a return call.

## 2015-05-25 ENCOUNTER — Emergency Department (HOSPITAL_COMMUNITY)
Admission: EM | Admit: 2015-05-25 | Discharge: 2015-05-25 | Disposition: A | Discharge status: Home / Self Care | Payer: Medicaid Other | Attending: Emergency Medicine | Admitting: Emergency Medicine

## 2015-05-25 ENCOUNTER — Encounter (HOSPITAL_COMMUNITY): Payer: Self-pay | Admitting: Emergency Medicine

## 2015-05-25 ENCOUNTER — Emergency Department (HOSPITAL_COMMUNITY): Payer: Medicaid Other

## 2015-05-25 DIAGNOSIS — M25552 Pain in left hip: Secondary | ICD-10-CM | POA: Diagnosis not present

## 2015-05-25 DIAGNOSIS — E669 Obesity, unspecified: Secondary | ICD-10-CM | POA: Diagnosis not present

## 2015-05-25 DIAGNOSIS — Z87440 Personal history of urinary (tract) infections: Secondary | ICD-10-CM | POA: Diagnosis not present

## 2015-05-25 DIAGNOSIS — G8918 Other acute postprocedural pain: Secondary | ICD-10-CM | POA: Insufficient documentation

## 2015-05-25 DIAGNOSIS — Z8619 Personal history of other infectious and parasitic diseases: Secondary | ICD-10-CM | POA: Diagnosis not present

## 2015-05-25 DIAGNOSIS — Z3202 Encounter for pregnancy test, result negative: Secondary | ICD-10-CM | POA: Diagnosis not present

## 2015-05-25 DIAGNOSIS — Z87891 Personal history of nicotine dependence: Secondary | ICD-10-CM | POA: Diagnosis not present

## 2015-05-25 DIAGNOSIS — J45909 Unspecified asthma, uncomplicated: Secondary | ICD-10-CM | POA: Insufficient documentation

## 2015-05-25 DIAGNOSIS — Z79899 Other long term (current) drug therapy: Secondary | ICD-10-CM | POA: Insufficient documentation

## 2015-05-25 DIAGNOSIS — Z8614 Personal history of Methicillin resistant Staphylococcus aureus infection: Secondary | ICD-10-CM | POA: Insufficient documentation

## 2015-05-25 DIAGNOSIS — F329 Major depressive disorder, single episode, unspecified: Secondary | ICD-10-CM | POA: Diagnosis not present

## 2015-05-25 DIAGNOSIS — M25531 Pain in right wrist: Secondary | ICD-10-CM | POA: Diagnosis not present

## 2015-05-25 DIAGNOSIS — Z862 Personal history of diseases of the blood and blood-forming organs and certain disorders involving the immune mechanism: Secondary | ICD-10-CM | POA: Diagnosis not present

## 2015-05-25 LAB — POC URINE PREG, ED: Preg Test, Ur: NEGATIVE

## 2015-05-25 MED ORDER — HYDROMORPHONE HCL 1 MG/ML IJ SOLN
1.0000 mg | Freq: Once | INTRAMUSCULAR | Status: AC
  Administered 2015-05-25: 1 mg via INTRAVENOUS
  Filled 2015-05-25: qty 1

## 2015-05-25 MED ORDER — ONDANSETRON 8 MG PO TBDP
8.0000 mg | ORAL_TABLET | Freq: Once | ORAL | Status: AC
  Administered 2015-05-25: 8 mg via ORAL
  Filled 2015-05-25: qty 1

## 2015-05-25 MED ORDER — IBUPROFEN 600 MG PO TABS: 600.0000 mg | ORAL_TABLET | Freq: Three times a day (TID) | ORAL | Status: DC | PRN
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
## 2015-05-25 NOTE — ED Notes (Addendum)
Per EMS pt complaint of left hip pain post fall/tripping on child's toy. 200 mcg of fentanyl given en route.

## 2015-05-25 NOTE — ED Notes (Signed)
Bed: ZO10 Expected date:  Expected time:  Means of arrival:  Comments: EMS- 23yo F, fall, hip pain, numbness

## 2015-05-25 NOTE — ED Notes (Signed)
Patient transported to X-ray 

## 2015-05-28 NOTE — Telephone Encounter (Signed)
Spoke with patient via phone.  Explained referral has been sent to [facility] for them to schedule the patient for an appointment.  Phone number for [facility] [phone] given to patient.  Told patient she may want to call to see if they will go ahead and schedule her.  Patient states understanding.

## 2015-06-24 ENCOUNTER — Ambulatory Visit: Payer: Self-pay | Admitting: General Surgery

## 2015-06-24 NOTE — H&P (Signed)
History of Present Illness Axel Filler MD; 06/24/2015 10:29 AM) Patient words: umb. hernia.  The patient is a 24 year old female who presents with an umbilical hernia. The patient is a 24 year old female who is referred by Dr. Kelton Pillar for evaluation of an umbilical hernia. Patient states that she previously had a laparoscopic cholecystectomy to 3 years ago. She had a primary hernia repair at that time as well. She states that since then she's become pregnant in the hernia had recurred and gotten bigger. She states currently she has pain in her hernia site. Patient does state that she is able to reduce this herself.   Other Problems Michel Bickers, LPN; 1/61/0960 45:40 AM) Cholelithiasis Transfusion history Umbilical Hernia Repair  Past Surgical History Michel Bickers, LPN; 9/81/1914 78:29 AM) Cesarean Section - Multiple Gallbladder Surgery - Laparoscopic Oral Surgery  Diagnostic Studies History Michel Bickers, LPN; 5/62/1308 65:78 AM) Colonoscopy never Mammogram never Pap Smear 1-5 years ago  Allergies Michel Bickers, LPN; 4/69/6295 28:41 AM) No Known Drug Allergies08/25/2016  Medication History Michel Bickers, LPN; 01/20/4009 27:25 AM) No Current Medications Medications Reconciled  Social History Michel Bickers, LPN; 3/66/4403 47:42 AM) Alcohol use Occasional alcohol use. Caffeine use Carbonated beverages. No drug use Tobacco use Former smoker.  Family History Michel Bickers, LPN; 5/95/6387 56:43 AM) Alcohol Abuse Mother. Bleeding disorder Daughter. Depression Mother. Seizure disorder Mother.  Pregnancy / Birth History Michel Bickers, LPN; 01/26/5187 41:66 AM) Age at menarche 7 years. Contraceptive History Depo-provera. Gravida 3 Maternal age 46-25 Para 3 Regular periods  Review of Systems Michel Bickers LPN; 0/63/0160 10:93 AM) General Not Present- Appetite Loss, Chills, Fatigue, Fever, Night Sweats, Weight Gain and Weight  Loss. Skin Not Present- Change in Wart/Mole, Dryness, Hives, Jaundice, New Lesions, Non-Healing Wounds, Rash and Ulcer. HEENT Not Present- Earache, Hearing Loss, Hoarseness, Nose Bleed, Oral Ulcers, Ringing in the Ears, Seasonal Allergies, Sinus Pain, Sore Throat, Visual Disturbances, Wears glasses/contact lenses and Yellow Eyes. Respiratory Not Present- Bloody sputum, Chronic Cough, Difficulty Breathing, Snoring and Wheezing. Breast Not Present- Breast Mass, Breast Pain, Nipple Discharge and Skin Changes. Cardiovascular Not Present- Chest Pain, Difficulty Breathing Lying Down, Leg Cramps, Palpitations, Rapid Heart Rate, Shortness of Breath and Swelling of Extremities. Gastrointestinal Not Present- Abdominal Pain, Bloating, Bloody Stool, Change in Bowel Habits, Chronic diarrhea, Constipation, Difficulty Swallowing, Excessive gas, Gets full quickly at meals, Hemorrhoids, Indigestion, Nausea, Rectal Pain and Vomiting. Female Genitourinary Not Present- Frequency, Nocturia, Painful Urination, Pelvic Pain and Urgency. Musculoskeletal Not Present- Back Pain, Joint Pain, Joint Stiffness, Muscle Pain, Muscle Weakness and Swelling of Extremities. Neurological Not Present- Decreased Memory, Fainting, Headaches, Numbness, Seizures, Tingling, Tremor, Trouble walking and Weakness. Psychiatric Not Present- Anxiety, Bipolar, Change in Sleep Pattern, Depression, Fearful and Frequent crying. Endocrine Not Present- Cold Intolerance, Excessive Hunger, Hair Changes, Heat Intolerance, Hot flashes and New Diabetes. Hematology Not Present- Easy Bruising, Excessive bleeding, Gland problems, HIV and Persistent Infections.   Vitals Tresa Endo Dockery LPN; 2/35/5732 20:25 AM) 06/24/2015 10:13 AM Weight: 296.2 lb Height: 60in Body Surface Area: 2.38 m Body Mass Index: 57.85 kg/m Temp.: 98.57F(Oral)  Pulse: 88 (Regular)  BP: 122/80 (Sitting, Left Arm, Standard)    Physical Exam Axel Filler MD; 06/24/2015  10:29 AM) General Mental Status-Alert. General Appearance-Consistent with stated age. Hydration-Well hydrated. Voice-Normal.  Head and Neck Head-normocephalic, atraumatic with no lesions or palpable masses. Trachea-midline. Thyroid Gland Characteristics - normal size and consistency.  Chest and Lung Exam Chest and lung exam reveals -quiet, even and easy respiratory effort with no  use of accessory muscles and on auscultation, normal breath sounds, no adventitious sounds and normal vocal resonance. Inspection Chest Wall - Normal. Back - normal.  Cardiovascular Cardiovascular examination reveals -normal heart sounds, regular rate and rhythm with no murmurs and normal pedal pulses bilaterally.  Abdomen Inspection Skin - Scar - no surgical scars. Hernias - Umbilical hernia - Reducible(Approximately 3-4 cm umbilical hernia, reducible). Palpation/Percussion Normal exam - Soft, Non Tender, No Rebound tenderness, No Rigidity (guarding) and No hepatosplenomegaly. Auscultation Normal exam - Bowel sounds normal.    Assessment & Plan (Clerance Umland MD; 06/24/2015 10:31 AM) UMBILICAL HERNIA WITHOUT OBSTRUCTION AND WITHOUT GANGRENE (553.1  K42.9) Impression: 23-year-old female with a moderate size umbilical hernia.  1. The patient will like to proceed to the operating room for laparoscopic umbilical hernia repair.  2. I discussed with the patient the signs and symptoms of incarceration and strangulation and the need to proceed to the ER should they occur.  3. I discussed with the patient the risks and benefits of the procedure to include but not limited to: Infection, bleeding, damage to surrounding structures, possible need for further surgery, possible nerve pain, and possible recurrence. The patient was understanding and wishes to proceed. 

## 2015-06-25 ENCOUNTER — Encounter: Payer: Self-pay | Admitting: *Deleted

## 2015-07-16 ENCOUNTER — Encounter (HOSPITAL_COMMUNITY): Payer: Self-pay

## 2015-07-16 ENCOUNTER — Other Ambulatory Visit (HOSPITAL_COMMUNITY): Payer: Self-pay | Admitting: *Deleted

## 2015-07-16 ENCOUNTER — Encounter (HOSPITAL_COMMUNITY)
Admission: RE | Admit: 2015-07-16 | Discharge: 2015-07-16 | Disposition: A | Discharge status: Home / Self Care | Payer: Medicaid Other | Source: Ambulatory Visit | Attending: General Surgery | Admitting: General Surgery

## 2015-07-16 DIAGNOSIS — K429 Umbilical hernia without obstruction or gangrene: Secondary | ICD-10-CM | POA: Insufficient documentation

## 2015-07-16 DIAGNOSIS — Z01812 Encounter for preprocedural laboratory examination: Secondary | ICD-10-CM | POA: Diagnosis present

## 2015-07-16 DIAGNOSIS — Z01810 Encounter for preprocedural cardiovascular examination: Secondary | ICD-10-CM | POA: Insufficient documentation

## 2015-07-16 HISTORY — DX: Personal history of other medical treatment: Z92.89

## 2015-07-16 LAB — CBC
HEMATOCRIT: 38.2 % (ref 36.0–46.0)
Hemoglobin: 11.8 g/dL — ABNORMAL LOW (ref 12.0–15.0)
MCH: 25.2 pg — AB (ref 26.0–34.0)
MCHC: 30.9 g/dL (ref 30.0–36.0)
MCV: 81.6 fL (ref 78.0–100.0)
Platelets: 347 10*3/uL (ref 150–400)
RBC: 4.68 MIL/uL (ref 3.87–5.11)
RDW: 14.7 % (ref 11.5–15.5)
WBC: 7.8 10*3/uL (ref 4.0–10.5)

## 2015-07-16 LAB — HCG, SERUM, QUALITATIVE: Preg, Serum: NEGATIVE

## 2015-07-16 NOTE — Pre-Procedure Instructions (Signed)
Laura Austin  07/16/2015    Your procedure is scheduled on Monday, July 26, 2015 at 7:30 AM.   Report to [facility] Entrance "A" Admitting Office at 5:30 AM.   Call this number if you have problems the morning of surgery: [phone]   Any questions prior to day of surgery, please call [phone] between 8 & 4 PM.    Remember:  Do not eat food or drink liquids after midnight Sunday, 07/25/15.    Do not wear jewelry, make-up or nail polish.  Do not wear lotions, powders, or perfumes.  You may wear deodorant.  Do not shave 48 hours prior to surgery.    Do not bring valuables to the hospital.  [facility] is not responsible for any belongings or valuables.  Contacts, dentures or bridgework may not be worn into surgery.  Leave your suitcase in the car.  After surgery it may be brought to your room.  For patients admitted to the hospital, discharge time will be determined by your treatment team.  Patients discharged the day of surgery will not be allowed to drive home.   Special instructions:  [facility] - Preparing for Surgery  Before surgery, you can play an important role.  Because skin is not sterile, your skin needs to be as free of germs as possible.  You can reduce the number of germs on you skin by washing with CHG (chlorahexidine gluconate) soap before surgery.  CHG is an antiseptic cleaner which kills germs and bonds with the skin to continue killing germs even after washing.  Please DO NOT use if you have an allergy to CHG or antibacterial soaps.  If your skin becomes reddened/irritated stop using the CHG and inform your nurse when you arrive at Short Stay.  Do not shave (including legs and underarms) for at least 48 hours prior to the first CHG shower.  You may shave your face.  Please follow these instructions carefully:   1.  Shower with CHG Soap the night before surgery and the                                morning of Surgery.  2.  If you  choose to wash your hair, wash your hair first as usual with your       normal shampoo.  3.  After you shampoo, rinse your hair and body thoroughly to remove the                      Shampoo.  4.  Use CHG as you would any other liquid soap.  You can apply chg directly       to the skin and wash gently with scrungie or a clean washcloth.  5.  Apply the CHG Soap to your body ONLY FROM THE NECK DOWN.        Do not use on open wounds or open sores.  Avoid contact with your eyes, ears, mouth and genitals (private parts).  Wash genitals (private parts) with your normal soap.  6.  Wash thoroughly, paying special attention to the area where your surgery        will be performed.  7.  Thoroughly rinse your body with warm water from the neck down.  8.  DO NOT shower/wash with your normal soap after using and rinsing off       the CHG Soap.  9.  Pat yourself dry with a clean towel.            10.  Wear clean pajamas.            11.  Place clean sheets on your bed the night of your first shower and do not        sleep with pets.  Day of Surgery  Do not apply any lotions the morning of surgery.  Please wear clean clothes to the hospital.   Please read over the following fact sheets that you were given. Pain Booklet, Coughing and Deep Breathing and Surgical Site Infection Prevention

## 2015-07-23 ENCOUNTER — Ambulatory Visit (INDEPENDENT_AMBULATORY_CARE_PROVIDER_SITE_OTHER): Payer: Medicaid Other | Admitting: *Deleted

## 2015-07-23 DIAGNOSIS — Z3042 Encounter for surveillance of injectable contraceptive: Secondary | ICD-10-CM | POA: Diagnosis present

## 2015-07-23 MED ORDER — MEDROXYPROGESTERONE ACETATE 150 MG/ML IM SUSP
150.0000 mg | Freq: Once | INTRAMUSCULAR | Status: AC
  Administered 2015-07-23: 150 mg via INTRAMUSCULAR

## 2015-07-25 MED ORDER — DEXTROSE 5 % IV SOLN
3.0000 g | INTRAVENOUS | Status: AC
  Administered 2015-07-26: 3 g via INTRAVENOUS
  Filled 2015-07-25: qty 3000

## 2015-07-26 ENCOUNTER — Ambulatory Visit (HOSPITAL_COMMUNITY)
Admission: RE | Admit: 2015-07-26 | Discharge: 2015-07-26 | Disposition: A | Discharge status: Home / Self Care | Payer: Medicaid Other | Source: Ambulatory Visit | Attending: General Surgery | Admitting: General Surgery

## 2015-07-26 ENCOUNTER — Ambulatory Visit (HOSPITAL_COMMUNITY): Payer: Medicaid Other | Admitting: Anesthesiology

## 2015-07-26 ENCOUNTER — Encounter (HOSPITAL_COMMUNITY): Payer: Self-pay

## 2015-07-26 ENCOUNTER — Ambulatory Visit: Payer: Medicaid Other

## 2015-07-26 ENCOUNTER — Encounter (HOSPITAL_COMMUNITY)
Admission: RE | Disposition: A | Discharge status: Home / Self Care | Payer: Self-pay | Source: Ambulatory Visit | Attending: General Surgery

## 2015-07-26 DIAGNOSIS — J45909 Unspecified asthma, uncomplicated: Secondary | ICD-10-CM | POA: Diagnosis not present

## 2015-07-26 DIAGNOSIS — K420 Umbilical hernia with obstruction, without gangrene: Secondary | ICD-10-CM | POA: Diagnosis not present

## 2015-07-26 DIAGNOSIS — Z886 Allergy status to analgesic agent status: Secondary | ICD-10-CM | POA: Diagnosis not present

## 2015-07-26 DIAGNOSIS — Z6841 Body Mass Index (BMI) 40.0 and over, adult: Secondary | ICD-10-CM | POA: Diagnosis not present

## 2015-07-26 DIAGNOSIS — I10 Essential (primary) hypertension: Secondary | ICD-10-CM | POA: Diagnosis not present

## 2015-07-26 DIAGNOSIS — Z8614 Personal history of Methicillin resistant Staphylococcus aureus infection: Secondary | ICD-10-CM | POA: Insufficient documentation

## 2015-07-26 DIAGNOSIS — M6281 Muscle weakness (generalized): Secondary | ICD-10-CM | POA: Diagnosis not present

## 2015-07-26 DIAGNOSIS — F4310 Post-traumatic stress disorder, unspecified: Secondary | ICD-10-CM | POA: Diagnosis not present

## 2015-07-26 DIAGNOSIS — Z87891 Personal history of nicotine dependence: Secondary | ICD-10-CM | POA: Diagnosis not present

## 2015-07-26 DIAGNOSIS — Z87440 Personal history of urinary (tract) infections: Secondary | ICD-10-CM | POA: Insufficient documentation

## 2015-07-26 DIAGNOSIS — K429 Umbilical hernia without obstruction or gangrene: Secondary | ICD-10-CM | POA: Diagnosis present

## 2015-07-26 DIAGNOSIS — R29898 Other symptoms and signs involving the musculoskeletal system: Secondary | ICD-10-CM

## 2015-07-26 HISTORY — PX: UMBILICAL HERNIA REPAIR: SHX196

## 2015-07-26 HISTORY — PX: INSERTION OF MESH: SHX5868

## 2015-07-26 SURGERY — REPAIR, HERNIA, UMBILICAL, LAPAROSCOPIC
Anesthesia: General | Site: Abdomen

## 2015-07-26 MED ORDER — MORPHINE SULFATE (PF) 4 MG/ML IV SOLN
4.0000 mg | INTRAVENOUS | Status: DC | PRN
  Administered 2015-07-26: 2 mg via INTRAVENOUS
  Filled 2015-07-26: qty 1

## 2015-07-26 MED ORDER — SODIUM CHLORIDE 0.9 % IJ SOLN: 3.0000 mL | Freq: Two times a day (BID) | INTRAMUSCULAR | Status: DC

## 2015-07-26 MED ORDER — PROPOFOL 10 MG/ML IV BOLUS
INTRAVENOUS | Status: DC | PRN
  Administered 2015-07-26: 200 mg via INTRAVENOUS

## 2015-07-26 MED ORDER — MIDAZOLAM HCL 2 MG/2ML IJ SOLN
INTRAMUSCULAR | Status: AC
  Filled 2015-07-26: qty 4

## 2015-07-26 MED ORDER — FENTANYL CITRATE (PF) 100 MCG/2ML IJ SOLN
INTRAMUSCULAR | Status: DC | PRN
  Administered 2015-07-26 (×2): 50 ug via INTRAVENOUS
  Administered 2015-07-26 (×2): 100 ug via INTRAVENOUS

## 2015-07-26 MED ORDER — SCOPOLAMINE 1 MG/3DAYS TD PT72
MEDICATED_PATCH | TRANSDERMAL | Status: DC | PRN
  Administered 2015-07-26: 1 via TRANSDERMAL

## 2015-07-26 MED ORDER — LACTATED RINGERS IV SOLN
INTRAVENOUS | Status: DC | PRN
  Administered 2015-07-26 (×2): via INTRAVENOUS

## 2015-07-26 MED ORDER — SCOPOLAMINE 1 MG/3DAYS TD PT72
MEDICATED_PATCH | TRANSDERMAL | Status: AC
  Filled 2015-07-26: qty 1

## 2015-07-26 MED ORDER — SODIUM CHLORIDE 0.9 % IJ SOLN: 3.0000 mL | INTRAMUSCULAR | Status: DC | PRN

## 2015-07-26 MED ORDER — ONDANSETRON HCL 4 MG/2ML IJ SOLN
INTRAMUSCULAR | Status: AC
  Filled 2015-07-26: qty 2

## 2015-07-26 MED ORDER — KETOROLAC TROMETHAMINE 30 MG/ML IJ SOLN
INTRAMUSCULAR | Status: DC | PRN
  Administered 2015-07-26: 30 mg via INTRAVENOUS

## 2015-07-26 MED ORDER — TRAMADOL HCL 50 MG PO TABS: 50.0000 mg | ORAL_TABLET | Freq: Four times a day (QID) | ORAL | Status: DC | PRN

## 2015-07-26 MED ORDER — LIDOCAINE HCL (CARDIAC) 20 MG/ML IV SOLN
INTRAVENOUS | Status: DC | PRN
  Administered 2015-07-26: 60 mg via INTRAVENOUS

## 2015-07-26 MED ORDER — KETOROLAC TROMETHAMINE 15 MG/ML IJ SOLN
INTRAMUSCULAR | Status: AC
  Filled 2015-07-26: qty 1

## 2015-07-26 MED ORDER — DEXAMETHASONE SODIUM PHOSPHATE 10 MG/ML IJ SOLN
INTRAMUSCULAR | Status: DC | PRN
  Administered 2015-07-26: 10 mg via INTRAVENOUS

## 2015-07-26 MED ORDER — BUPIVACAINE HCL 0.25 % IJ SOLN
INTRAMUSCULAR | Status: DC | PRN
  Administered 2015-07-26: 8 mL

## 2015-07-26 MED ORDER — KETOROLAC TROMETHAMINE 30 MG/ML IJ SOLN
INTRAMUSCULAR | Status: AC
  Filled 2015-07-26: qty 1

## 2015-07-26 MED ORDER — HYDROMORPHONE HCL 1 MG/ML IJ SOLN
INTRAMUSCULAR | Status: AC
  Filled 2015-07-26: qty 1

## 2015-07-26 MED ORDER — LIDOCAINE HCL (CARDIAC) 20 MG/ML IV SOLN
INTRAVENOUS | Status: AC
  Filled 2015-07-26: qty 5

## 2015-07-26 MED ORDER — EPHEDRINE SULFATE 50 MG/ML IJ SOLN
INTRAMUSCULAR | Status: AC
  Filled 2015-07-26: qty 1

## 2015-07-26 MED ORDER — NEOSTIGMINE METHYLSULFATE 10 MG/10ML IV SOLN
INTRAVENOUS | Status: AC
  Filled 2015-07-26: qty 1

## 2015-07-26 MED ORDER — SUCCINYLCHOLINE CHLORIDE 20 MG/ML IJ SOLN
INTRAMUSCULAR | Status: AC
  Filled 2015-07-26: qty 1

## 2015-07-26 MED ORDER — BUPIVACAINE HCL (PF) 0.25 % IJ SOLN
INTRAMUSCULAR | Status: AC
  Filled 2015-07-26: qty 30

## 2015-07-26 MED ORDER — 0.9 % SODIUM CHLORIDE (POUR BTL) OPTIME
TOPICAL | Status: DC | PRN
  Administered 2015-07-26: 1000 mL

## 2015-07-26 MED ORDER — ONDANSETRON HCL 4 MG/2ML IJ SOLN
INTRAMUSCULAR | Status: DC | PRN
  Administered 2015-07-26: 4 mg via INTRAVENOUS

## 2015-07-26 MED ORDER — SODIUM CHLORIDE 0.9 % IJ SOLN
INTRAMUSCULAR | Status: AC
  Filled 2015-07-26: qty 10

## 2015-07-26 MED ORDER — MORPHINE SULFATE (PF) 2 MG/ML IV SOLN
INTRAVENOUS | Status: AC
  Administered 2015-07-26: 2 mg via INTRAVENOUS
  Filled 2015-07-26: qty 1

## 2015-07-26 MED ORDER — HYDROMORPHONE HCL 1 MG/ML IJ SOLN
0.2500 mg | INTRAMUSCULAR | Status: DC | PRN
  Administered 2015-07-26: 0.25 mg via INTRAVENOUS
  Administered 2015-07-26 (×3): 0.5 mg via INTRAVENOUS
  Administered 2015-07-26: 0.25 mg via INTRAVENOUS

## 2015-07-26 MED ORDER — KETOROLAC TROMETHAMINE 15 MG/ML IJ SOLN
15.0000 mg | Freq: Four times a day (QID) | INTRAMUSCULAR | Status: DC
  Administered 2015-07-26: 15 mg via INTRAVENOUS
  Filled 2015-07-26: qty 1

## 2015-07-26 MED ORDER — ROCURONIUM BROMIDE 50 MG/5ML IV SOLN
INTRAVENOUS | Status: AC
  Filled 2015-07-26: qty 1

## 2015-07-26 MED ORDER — FENTANYL CITRATE (PF) 250 MCG/5ML IJ SOLN
INTRAMUSCULAR | Status: AC
  Filled 2015-07-26: qty 5

## 2015-07-26 MED ORDER — GLYCOPYRROLATE 0.2 MG/ML IJ SOLN
INTRAMUSCULAR | Status: DC | PRN
  Administered 2015-07-26: 0.4 mg via INTRAVENOUS

## 2015-07-26 MED ORDER — ROCURONIUM BROMIDE 100 MG/10ML IV SOLN
INTRAVENOUS | Status: DC | PRN
  Administered 2015-07-26: 30 mg via INTRAVENOUS
  Administered 2015-07-26: 20 mg via INTRAVENOUS

## 2015-07-26 MED ORDER — PROPOFOL 10 MG/ML IV BOLUS
INTRAVENOUS | Status: AC
  Filled 2015-07-26: qty 20

## 2015-07-26 MED ORDER — CHLORHEXIDINE GLUCONATE 4 % EX LIQD: 1.0000 "application " | Freq: Once | CUTANEOUS | Status: DC

## 2015-07-26 MED ORDER — GLYCOPYRROLATE 0.2 MG/ML IJ SOLN
INTRAMUSCULAR | Status: AC
  Filled 2015-07-26: qty 1

## 2015-07-26 MED ORDER — DEXAMETHASONE SODIUM PHOSPHATE 10 MG/ML IJ SOLN
INTRAMUSCULAR | Status: AC
  Filled 2015-07-26: qty 1

## 2015-07-26 MED ORDER — NEOSTIGMINE METHYLSULFATE 10 MG/10ML IV SOLN
INTRAVENOUS | Status: DC | PRN
Start: 2015-07-26 — End: 2015-07-26
  Administered 2015-07-26: 3 mg via INTRAVENOUS

## 2015-07-26 MED ORDER — MIDAZOLAM HCL 5 MG/5ML IJ SOLN
INTRAMUSCULAR | Status: DC | PRN
  Administered 2015-07-26: 2 mg via INTRAVENOUS

## 2015-07-26 MED ORDER — SODIUM CHLORIDE 0.9 % IV SOLN: 250.0000 mL | INTRAVENOUS | Status: DC | PRN

## 2015-07-26 SURGICAL SUPPLY — 55 items
APPLIER CLIP LOGIC TI 5 (MISCELLANEOUS) IMPLANT
APPLIER CLIP ROT 10 11.4 M/L (STAPLE)
BENZOIN TINCTURE PRP APPL 2/3 (GAUZE/BANDAGES/DRESSINGS) ×3 IMPLANT
BINDER ABD UNIV 12 45-62 (WOUND CARE) ×1 IMPLANT
BINDER ABDOMINAL 46IN 62IN (WOUND CARE) ×3
BLADE SURG ROTATE 9660 (MISCELLANEOUS) IMPLANT
CANISTER SUCTION 2500CC (MISCELLANEOUS) IMPLANT
CHLORAPREP W/TINT 26ML (MISCELLANEOUS) ×3 IMPLANT
CLIP APPLIE ROT 10 11.4 M/L (STAPLE) IMPLANT
CLOSURE WOUND 1/2 X4 (GAUZE/BANDAGES/DRESSINGS) ×1
COVER SURGICAL LIGHT HANDLE (MISCELLANEOUS) ×3 IMPLANT
DEVICE SECURE STRAP 25 ABSORB (INSTRUMENTS) ×6 IMPLANT
DEVICE TROCAR PUNCTURE CLOSURE (ENDOMECHANICALS) ×3 IMPLANT
ELECT REM PT RETURN 9FT ADLT (ELECTROSURGICAL) ×3
ELECTRODE REM PT RTRN 9FT ADLT (ELECTROSURGICAL) ×1 IMPLANT
GAUZE SPONGE 2X2 8PLY STRL LF (GAUZE/BANDAGES/DRESSINGS) ×1 IMPLANT
GAUZE SPONGE 4X4 12PLY STRL (GAUZE/BANDAGES/DRESSINGS) ×3 IMPLANT
GLOVE BIO SURGEON STRL SZ7.5 (GLOVE) ×3 IMPLANT
GLOVE BIO SURGEON STRL SZ8 (GLOVE) ×3 IMPLANT
GLOVE BIOGEL PI IND STRL 6 (GLOVE) ×1 IMPLANT
GLOVE BIOGEL PI IND STRL 7.0 (GLOVE) ×1 IMPLANT
GLOVE BIOGEL PI IND STRL 8.5 (GLOVE) ×1 IMPLANT
GLOVE BIOGEL PI INDICATOR 6 (GLOVE) ×2
GLOVE BIOGEL PI INDICATOR 7.0 (GLOVE) ×2
GLOVE BIOGEL PI INDICATOR 8.5 (GLOVE) ×2
GLOVE SURG SS PI 7.0 STRL IVOR (GLOVE) ×3 IMPLANT
GOWN STRL REUS W/ TWL LRG LVL3 (GOWN DISPOSABLE) ×1 IMPLANT
GOWN STRL REUS W/ TWL XL LVL3 (GOWN DISPOSABLE) ×2 IMPLANT
GOWN STRL REUS W/TWL LRG LVL3 (GOWN DISPOSABLE) ×2
GOWN STRL REUS W/TWL XL LVL3 (GOWN DISPOSABLE) ×4
KIT BASIN OR (CUSTOM PROCEDURE TRAY) ×3 IMPLANT
KIT ROOM TURNOVER OR (KITS) ×3 IMPLANT
MARKER SKIN DUAL TIP RULER LAB (MISCELLANEOUS) ×3 IMPLANT
MESH VENTRALIGHT ST 8IN CRC (Mesh General) ×3 IMPLANT
NEEDLE INSUFFLATION 14GA 120MM (NEEDLE) ×3 IMPLANT
NEEDLE SPNL 22GX3.5 QUINCKE BK (NEEDLE) IMPLANT
NS IRRIG 1000ML POUR BTL (IV SOLUTION) ×3 IMPLANT
PAD ARMBOARD 7.5X6 YLW CONV (MISCELLANEOUS) ×6 IMPLANT
SCISSORS LAP 5X35 DISP (ENDOMECHANICALS) ×3 IMPLANT
SET IRRIG TUBING LAPAROSCOPIC (IRRIGATION / IRRIGATOR) IMPLANT
SLEEVE ENDOPATH XCEL 5M (ENDOMECHANICALS) ×6 IMPLANT
SPONGE GAUZE 2X2 STER 10/PKG (GAUZE/BANDAGES/DRESSINGS) ×2
STRIP CLOSURE SKIN 1/2X4 (GAUZE/BANDAGES/DRESSINGS) ×2 IMPLANT
SUT CHROMIC 2 0 SH (SUTURE) ×3 IMPLANT
SUT ETHIBOND NAB CT1 #1 30IN (SUTURE) ×6 IMPLANT
SUT MNCRL AB 4-0 PS2 18 (SUTURE) ×3 IMPLANT
SUT PROLENE 2 0 KS (SUTURE) ×9 IMPLANT
TOWEL OR 17X24 6PK STRL BLUE (TOWEL DISPOSABLE) ×3 IMPLANT
TOWEL OR 17X26 10 PK STRL BLUE (TOWEL DISPOSABLE) ×3 IMPLANT
TRAY FOLEY CATH 14FR (SET/KITS/TRAYS/PACK) ×3 IMPLANT
TRAY LAPAROSCOPIC MC (CUSTOM PROCEDURE TRAY) ×3 IMPLANT
TROCAR XCEL BLUNT TIP 100MML (ENDOMECHANICALS) IMPLANT
TROCAR XCEL NON-BLD 11X100MML (ENDOMECHANICALS) IMPLANT
TROCAR XCEL NON-BLD 5MMX100MML (ENDOMECHANICALS) ×3 IMPLANT
TUBING INSUFFLATION (TUBING) ×3 IMPLANT
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
## 2015-07-26 NOTE — Discharge Instructions (Signed)
[facility] _______[facility] ° °UMBILICAL HERNIA REPAIR: POST OP INSTRUCTIONS ° °Always review your discharge instruction sheet given to you by the facility where your surgery was performed. °IF YOU HAVE DISABILITY OR FAMILY LEAVE FORMS, YOU MUST BRING THEM TO THE OFFICE FOR PROCESSING.   °DO NOT GIVE THEM TO YOUR DOCTOR. ° °1. A  prescription for pain medication may be given to you upon discharge.  Take your pain medication as prescribed, if needed.  If narcotic pain medicine is not needed, then you may take acetaminophen (Tylenol) or ibuprofen (Advil) as needed. °2. Take your usually prescribed medications unless otherwise directed. °3. If you need a refill on your pain medication, please contact your pharmacy.  They will contact our office to request authorization. Prescriptions will not be filled after 5 pm or on week-ends. °4. You should follow a light diet the first 24 hours after arrival home, such as soup and crackers, etc.  Be sure to include lots of fluids daily.  Resume your normal diet the day after surgery. °5. Most patients will experience some swelling and bruising around the umbilicus or in the groin and scrotum.  Ice packs and reclining will help.  Swelling and bruising can take several days to resolve.  °6. It is common to experience some constipation if taking pain medication after surgery.  Increasing fluid intake and taking a stool softener (such as Colace) will usually help or prevent this problem from occurring.  A mild laxative (Milk of Magnesia or Miralax) should be taken according to package directions if there are no bowel movements after 48 hours. °7. Unless discharge instructions indicate otherwise, you may remove your bandages 24-48 hours after surgery, and you may shower at that time.  You may have steri-strips (small skin tapes) in place directly over the incision.  These strips should be left on the skin for 7-10 days.  If your surgeon used skin glue on the incision, you  may shower in 24 hours.  The glue will flake off over the next 2-3 weeks.  Any sutures or staples will be removed at the office during your follow-up visit. °8. ACTIVITIES:  You may resume regular (light) daily activities beginning the next day--such as daily self-care, walking, climbing stairs--gradually increasing activities as tolerated.  You may have sexual intercourse when it is comfortable.  Refrain from any heavy lifting or straining until approved by your doctor. °a. You may drive when you are no longer taking prescription pain medication, you can comfortably wear a seatbelt, and you can safely maneuver your car and apply brakes. °b. RETURN TO WORK:  __________________________________________________________ °9. You should see your doctor in the office for a follow-up appointment approximately 2-3 weeks after your surgery.  Make sure that you call for this appointment within a day or two after you arrive home to insure a convenient appointment time. °10. OTHER INSTRUCTIONS:  __________________________________________________________________________________________________________________________________________________________________________________________  °WHEN TO CALL YOUR DOCTOR: °1. Fever over 101.0 °2. Inability to urinate °3. Nausea and/or vomiting °4. Extreme swelling or bruising °5. Continued bleeding from incision. °6. Increased pain, redness, or drainage from the incision ° °The clinic staff is available to answer your questions during regular business hours.  Please don’t hesitate to call and ask to speak to one of the nurses for clinical concerns.  If you have a medical emergency, go to the nearest emergency room or call 911.  A surgeon from [facility] is always on call at the hospital ° ° °[address], [address], [place], [place]  [address] ? ° [address] [address], [place], [place]   [address] °[phone] ? [phone] ? FAX [phone] °Web site:  [email or web address] ° °

## 2015-07-26 NOTE — Anesthesia Procedure Notes (Signed)
Procedure Name: Intubation Date/Time: 07/26/2015 7:39 AM Performed by: Izora Gala Pre-anesthesia Checklist: Patient identified, Emergency Drugs available, Suction available and Patient being monitored Patient Re-evaluated:Patient Re-evaluated prior to inductionOxygen Delivery Method: Circle system utilized Preoxygenation: Pre-oxygenation with 100% oxygen Intubation Type: IV induction Ventilation: Mask ventilation without difficulty Laryngoscope Size: Miller and 3 Grade View: Grade II Tube type: Oral Tube size: 7.0 mm Number of attempts: 1 Airway Equipment and Method: Stylet and LTA kit utilized Placement Confirmation: ETT inserted through vocal cords under direct vision,  positive ETCO2 and breath sounds checked- equal and bilateral Secured at: 21 cm Tube secured with: Tape Dental Injury: Teeth and Oropharynx as per pre-operative assessment

## 2015-07-26 NOTE — Op Note (Signed)
07/26/2015  9:07 AM  PATIENT:  Laura Austin  24 y.o. female  PRE-OPERATIVE DIAGNOSIS:  UMBILICAL HERNIA REPAIR  POST-OPERATIVE DIAGNOSIS:  INCARCERATED UMBILICAL HERNIA REPAIR  PROCEDURE:  Procedure(s): LAPAROSCOPIC UMBILICAL HERNIA REPAIR (N/A) INSERTION OF MESH (N/A)  SURGEON:  Surgeon(s) and Role:    * Axel Filler, MD - Primary  ANESTHESIA:   local and general  EBL:10cc Total I/O In: 1000 [I.V.:1000] Out: 100 [Urine:100]  BLOOD ADMINISTERED:none  DRAINS: none   LOCAL MEDICATIONS USED:  BUPIVICAINE   SPECIMEN:  No Specimen  DISPOSITION OF SPECIMEN:  N/A  COUNTS:  YES  TOURNIQUET:  * No tourniquets in log *  DICTATION: .Dragon Dictation  Details of the procedure:   After the patient was consented patient was taken back to the operating room patient was then placed in supine position bilateral SCDs in place.  The patient was prepped and draped in the usual sterile fashion. After antibiotics were confirmed a timeout was called and all facts were verified. The Veress needle technique was used to insuflate the abdomen at Palmer's point. The abdomen was insufflated to 14 mm mercury. Subsequently a 5 mm trocar was placed a camera inserted there was no injury to any intra-abdominal organs.    There was seen to be an incarcerated  3cm umbilical hernia.  A second camera port was in placed into the left lower quadrant.   At this the Falicform ligament was taken down with Bovie cautery maintaining hemostasis. A 5mm port was placed in the epigastrium .   I proceeded to reduce the hernia contents.  Once the hernia was cleared away, a Bard Ventralight 20.3cm mesh was inserted into the abdomen.  The mesh was secured circumferentially with am Securestrap tacker in a double crown fashion.  2-0 prolenes were used at 3:00, 6:00, 9:00, and 12:00 as transfascial sutures using a endoclose decive.    The omentum was brought over the area of the mesh. The pneumoperitoneum was evacuated   & all trocars  were removed. The skin was reapproximated with 4-0  Monocryl sutures in a subcuticular fashion. The skin was dressed with Steri-Strips tape and gauze.  The patient was taken to the recovery room in stable condition.   PLAN OF CARE: Discharge to home after PACU  PATIENT DISPOSITION:  PACU - hemodynamically stable.   Delay start of Pharmacological VTE agent (>24hrs) due to surgical blood loss or risk of bleeding: not applicable

## 2015-07-26 NOTE — Progress Notes (Signed)
Patient went to get up to go to the bathroom and patient states she couldn't move her left leg. She states no pain when moving and can feel nurse squeeze foot and leg. When asked to move toes patient stated she was moving her toes, but nurse witnessed no movement. Patient could not feel me rub my nail on top or bottom of her foot. Dr. Sampson Goon notified and at bedside. Patient once in wheelchair started complaining of left calf pain, no warmth or redness. Patient states "its sore like after a cramp" Dr. Derrell Lolling notified, waiting for return page.

## 2015-07-26 NOTE — H&P (Signed)
History of Present Illness Axel Filler MD; 06/24/2015 10:29 AM) Patient words: umb. hernia.  The patient is a 24 year old female who presents with an umbilical hernia. The patient is a 24 year old female who is referred by Dr. Kelton Pillar for evaluation of an umbilical hernia. Patient states that she previously had a laparoscopic cholecystectomy to 3 years ago. She had a primary hernia repair at that time as well. She states that since then she's become pregnant in the hernia had recurred and gotten bigger. She states currently she has pain in her hernia site. Patient does state that she is able to reduce this herself.   Other Problems Michel Bickers, LPN; 11/17/1476 29:56 AM) Cholelithiasis Transfusion history Umbilical Hernia Repair  Past Surgical History Michel Bickers, LPN; 12/13/863 78:46 AM) Cesarean Section - Multiple Gallbladder Surgery - Laparoscopic Oral Surgery  Diagnostic Studies History Michel Bickers, LPN; 9/62/9528 41:32 AM) Colonoscopy never Mammogram never Pap Smear 1-5 years ago  Allergies Michel Bickers, LPN; 4/40/1027 25:36 AM) No Known Drug Allergies08/25/2016  Medication History Michel Bickers, LPN; 6/44/0347 42:59 AM) No Current Medications Medications Reconciled  Social History Michel Bickers, LPN; 5/63/8756 43:32 AM) Alcohol use Occasional alcohol use. Caffeine use Carbonated beverages. No drug use Tobacco use Former smoker.  Family History Michel Bickers, LPN; 9/51/8841 66:06 AM) Alcohol Abuse Mother. Bleeding disorder Daughter. Depression Mother. Seizure disorder Mother.  Pregnancy / Birth History Michel Bickers, LPN; 12/28/6008 93:23 AM) Age at menarche 7 years. Contraceptive History Depo-provera. Gravida 3 Maternal age 47-25 Para 3 Regular periods  Review of Systems Michel Bickers LPN; 5/57/3220 25:42 AM) General Not Present- Appetite Loss, Chills, Fatigue, Fever, Night Sweats, Weight Gain and Weight  Loss. Skin Not Present- Change in Wart/Mole, Dryness, Hives, Jaundice, New Lesions, Non-Healing Wounds, Rash and Ulcer. HEENT Not Present- Earache, Hearing Loss, Hoarseness, Nose Bleed, Oral Ulcers, Ringing in the Ears, Seasonal Allergies, Sinus Pain, Sore Throat, Visual Disturbances, Wears glasses/contact lenses and Yellow Eyes. Respiratory Not Present- Bloody sputum, Chronic Cough, Difficulty Breathing, Snoring and Wheezing. Breast Not Present- Breast Mass, Breast Pain, Nipple Discharge and Skin Changes. Cardiovascular Not Present- Chest Pain, Difficulty Breathing Lying Down, Leg Cramps, Palpitations, Rapid Heart Rate, Shortness of Breath and Swelling of Extremities. Gastrointestinal Not Present- Abdominal Pain, Bloating, Bloody Stool, Change in Bowel Habits, Chronic diarrhea, Constipation, Difficulty Swallowing, Excessive gas, Gets full quickly at meals, Hemorrhoids, Indigestion, Nausea, Rectal Pain and Vomiting. Female Genitourinary Not Present- Frequency, Nocturia, Painful Urination, Pelvic Pain and Urgency. Musculoskeletal Not Present- Back Pain, Joint Pain, Joint Stiffness, Muscle Pain, Muscle Weakness and Swelling of Extremities. Neurological Not Present- Decreased Memory, Fainting, Headaches, Numbness, Seizures, Tingling, Tremor, Trouble walking and Weakness. Psychiatric Not Present- Anxiety, Bipolar, Change in Sleep Pattern, Depression, Fearful and Frequent crying. Endocrine Not Present- Cold Intolerance, Excessive Hunger, Hair Changes, Heat Intolerance, Hot flashes and New Diabetes. Hematology Not Present- Easy Bruising, Excessive bleeding, Gland problems, HIV and Persistent Infections.   Vitals Tresa Endo Dockery LPN; 05/05/2375 28:31 AM) 06/24/2015 10:13 AM Weight: 296.2 lb Height: 60in Body Surface Area: 2.38 m Body Mass Index: 57.85 kg/m Temp.: 98.64F(Oral)  Pulse: 88 (Regular)  BP: 122/80 (Sitting, Left Arm, Standard)    Physical Exam Axel Filler MD; 06/24/2015  10:29 AM) General Mental Status-Alert. General Appearance-Consistent with stated age. Hydration-Well hydrated. Voice-Normal.  Head and Neck Head-normocephalic, atraumatic with no lesions or palpable masses. Trachea-midline. Thyroid Gland Characteristics - normal size and consistency.  Chest and Lung Exam Chest and lung exam reveals -quiet, even and easy respiratory effort with no  use of accessory muscles and on auscultation, normal breath sounds, no adventitious sounds and normal vocal resonance. Inspection Chest Wall - Normal. Back - normal.  Cardiovascular Cardiovascular examination reveals -normal heart sounds, regular rate and rhythm with no murmurs and normal pedal pulses bilaterally.  Abdomen Inspection Skin - Scar - no surgical scars. Hernias - Umbilical hernia - Reducible(Approximately 3-4 cm umbilical hernia, reducible). Palpation/Percussion Normal exam - Soft, Non Tender, No Rebound tenderness, No Rigidity (guarding) and No hepatosplenomegaly. Auscultation Normal exam - Bowel sounds normal.    Assessment & Plan Axel Filler MD; 06/24/2015 10:31 AM) UMBILICAL HERNIA WITHOUT OBSTRUCTION AND WITHOUT GANGRENE (553.1  K42.9) Impression: 24 year old female with a moderate size umbilical hernia.  1. The patient will like to proceed to the operating room for laparoscopic umbilical hernia repair.  2. I discussed with the patient the signs and symptoms of incarceration and strangulation and the need to proceed to the ER should they occur.  3. I discussed with the patient the risks and benefits of the procedure to include but not limited to: Infection, bleeding, damage to surrounding structures, possible need for further surgery, possible nerve pain, and possible recurrence. The patient was understanding and wishes to proceed.

## 2015-07-26 NOTE — Anesthesia Postprocedure Evaluation (Signed)
  Anesthesia Post-op Note  Patient: Laura Austin  Procedure(s) Performed: Procedure(s): LAPAROSCOPIC UMBILICAL HERNIA REPAIR (N/A) INSERTION OF MESH (N/A)  Patient Location: PACU  Anesthesia Type:General  Level of Consciousness: awake and alert   Airway and Oxygen Therapy: Patient Spontanous Breathing  Post-op Pain: Controlled  Post-op Assessment: Post-op Vital signs reviewed, Patient's Cardiovascular Status Stable and Respiratory Function Stable  Post-op Vital Signs: Reviewed  Filed Vitals:   07/26/15 1015  BP: 163/63  Pulse: 59  Temp:   Resp: 19    Complications: No apparent anesthesia complications

## 2015-07-26 NOTE — Progress Notes (Signed)
Patient got up and walked to the bathroom, patient states her legs feel much better. Patient never made it down for her MRI, neurology states that if patient can walk with no problems there is not need to the MRI. Dr. Derrell Lolling notified of patient's progression and that she would be going home. Patient stable no complaints and states she is ready to go home.

## 2015-07-26 NOTE — Transfer of Care (Signed)
Immediate Anesthesia Transfer of Care Note  Patient: Laura Austin  Procedure(s) Performed: Procedure(s): LAPAROSCOPIC UMBILICAL HERNIA REPAIR (N/A) INSERTION OF MESH (N/A)  Patient Location: PACU  Anesthesia Type:General  Level of Consciousness: awake and sedated  Airway & Oxygen Therapy: Patient Spontanous Breathing and Patient connected to nasal cannula oxygen  Post-op Assessment: Report given to RN, Post -op Vital signs reviewed and stable and Patient moving all extremities  Post vital signs: Reviewed and stable  Last Vitals:  Filed Vitals:   07/26/15 0920  BP: 147/77  Pulse: 93  Temp: 36.7 C  Resp: 19    Complications: No apparent anesthesia complications

## 2015-07-26 NOTE — Anesthesia Preprocedure Evaluation (Addendum)
Anesthesia Evaluation  Patient identified by MRN, date of birth, ID band Patient awake    Reviewed: Allergy & Precautions, H&P , NPO status , Patient's Chart, lab work & pertinent test results  Airway Mallampati: II  TM Distance: >3 FB Neck ROM: Full    Dental no notable dental hx. (+) Teeth Intact, Dental Advisory Given   Pulmonary asthma , former smoker,    Pulmonary exam normal breath sounds clear to auscultation       Cardiovascular hypertension,  Rhythm:Regular Rate:Normal     Neuro/Psych Depression negative neurological ROS     GI/Hepatic negative GI ROS, Neg liver ROS,   Endo/Other  Morbid obesity  Renal/GU negative Renal ROS  negative genitourinary   Musculoskeletal   Abdominal   Peds  Hematology negative hematology ROS (+)   Anesthesia Other Findings   Reproductive/Obstetrics negative OB ROS                            Anesthesia Physical Anesthesia Plan  ASA: III  Anesthesia Plan: General   Post-op Pain Management:    Induction: Intravenous  Airway Management Planned: Oral ETT  Additional Equipment:   Intra-op Plan:   Post-operative Plan: Extubation in OR  Informed Consent: I have reviewed the patients History and Physical, chart, labs and discussed the procedure including the risks, benefits and alternatives for the proposed anesthesia with the patient or authorized representative who has indicated his/her understanding and acceptance.   Dental advisory given  Plan Discussed with: CRNA  Anesthesia Plan Comments:         Anesthesia Quick Evaluation

## 2015-07-27 ENCOUNTER — Encounter (HOSPITAL_COMMUNITY): Payer: Self-pay | Admitting: General Surgery

## 2015-10-07 ENCOUNTER — Emergency Department (HOSPITAL_COMMUNITY): Payer: Medicaid Other

## 2015-10-07 ENCOUNTER — Encounter (HOSPITAL_COMMUNITY): Payer: Self-pay | Admitting: Emergency Medicine

## 2015-10-07 ENCOUNTER — Emergency Department (HOSPITAL_COMMUNITY)
Admission: EM | Admit: 2015-10-07 | Discharge: 2015-10-08 | Disposition: A | Discharge status: Home / Self Care | Payer: Medicaid Other | Attending: Emergency Medicine | Admitting: Emergency Medicine

## 2015-10-07 DIAGNOSIS — Z862 Personal history of diseases of the blood and blood-forming organs and certain disorders involving the immune mechanism: Secondary | ICD-10-CM | POA: Diagnosis not present

## 2015-10-07 DIAGNOSIS — Z8619 Personal history of other infectious and parasitic diseases: Secondary | ICD-10-CM | POA: Diagnosis not present

## 2015-10-07 DIAGNOSIS — Z8614 Personal history of Methicillin resistant Staphylococcus aureus infection: Secondary | ICD-10-CM | POA: Insufficient documentation

## 2015-10-07 DIAGNOSIS — M6281 Muscle weakness (generalized): Secondary | ICD-10-CM | POA: Insufficient documentation

## 2015-10-07 DIAGNOSIS — Z87440 Personal history of urinary (tract) infections: Secondary | ICD-10-CM | POA: Insufficient documentation

## 2015-10-07 DIAGNOSIS — M6289 Other specified disorders of muscle: Secondary | ICD-10-CM | POA: Diagnosis not present

## 2015-10-07 DIAGNOSIS — H538 Other visual disturbances: Secondary | ICD-10-CM | POA: Insufficient documentation

## 2015-10-07 DIAGNOSIS — Z8659 Personal history of other mental and behavioral disorders: Secondary | ICD-10-CM | POA: Diagnosis not present

## 2015-10-07 DIAGNOSIS — E669 Obesity, unspecified: Secondary | ICD-10-CM | POA: Diagnosis not present

## 2015-10-07 DIAGNOSIS — Z87891 Personal history of nicotine dependence: Secondary | ICD-10-CM | POA: Diagnosis not present

## 2015-10-07 DIAGNOSIS — J45909 Unspecified asthma, uncomplicated: Secondary | ICD-10-CM | POA: Insufficient documentation

## 2015-10-07 DIAGNOSIS — Z8673 Personal history of transient ischemic attack (TIA), and cerebral infarction without residual deficits: Secondary | ICD-10-CM | POA: Insufficient documentation

## 2015-10-07 DIAGNOSIS — R531 Weakness: Secondary | ICD-10-CM

## 2015-10-07 DIAGNOSIS — R51 Headache: Secondary | ICD-10-CM | POA: Diagnosis present

## 2015-10-07 LAB — DIFFERENTIAL
BASOS PCT: 0 %
Basophils Absolute: 0 10*3/uL (ref 0.0–0.1)
EOS PCT: 3 %
Eosinophils Absolute: 0.2 10*3/uL (ref 0.0–0.7)
Lymphocytes Relative: 42 %
Lymphs Abs: 3.8 10*3/uL (ref 0.7–4.0)
MONO ABS: 0.5 10*3/uL (ref 0.1–1.0)
MONOS PCT: 5 %
Neutro Abs: 4.6 10*3/uL (ref 1.7–7.7)
Neutrophils Relative %: 50 %

## 2015-10-07 LAB — I-STAT CHEM 8, ED
BUN: 9 mg/dL (ref 6–20)
CALCIUM ION: 1.12 mmol/L (ref 1.12–1.23)
CREATININE: 0.6 mg/dL (ref 0.44–1.00)
Chloride: 105 mmol/L (ref 101–111)
GLUCOSE: 99 mg/dL (ref 65–99)
HCT: 38 % (ref 36.0–46.0)
HEMOGLOBIN: 12.9 g/dL (ref 12.0–15.0)
Potassium: 3.3 mmol/L — ABNORMAL LOW (ref 3.5–5.1)
Sodium: 142 mmol/L (ref 135–145)
TCO2: 23 mmol/L (ref 0–100)

## 2015-10-07 LAB — CBC
HEMATOCRIT: 37 % (ref 36.0–46.0)
Hemoglobin: 11.5 g/dL — ABNORMAL LOW (ref 12.0–15.0)
MCH: 24.7 pg — AB (ref 26.0–34.0)
MCHC: 31.1 g/dL (ref 30.0–36.0)
MCV: 79.4 fL (ref 78.0–100.0)
PLATELETS: 330 10*3/uL (ref 150–400)
RBC: 4.66 MIL/uL (ref 3.87–5.11)
RDW: 15.8 % — AB (ref 11.5–15.5)
WBC: 9.1 10*3/uL (ref 4.0–10.5)

## 2015-10-07 LAB — I-STAT TROPONIN, ED: Troponin i, poc: 0 ng/mL (ref 0.00–0.08)

## 2015-10-07 LAB — PROTIME-INR
INR: 1.1 (ref 0.00–1.49)
PROTHROMBIN TIME: 14.4 s (ref 11.6–15.2)

## 2015-10-07 LAB — APTT: aPTT: 36 seconds (ref 24–37)
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
## 2015-10-07 NOTE — ED Notes (Signed)
Per ems- pt had sudden onset L sided headache, L side blurred vision and feeling off balance at 10pm tonight. Pt also began to have increasing L sided hand numbness. Pt with hx of stroke after giving birth in 2013. Significant family hx of stroke at young age as well. Pt currently alert and oriented. Pt transported directly to MRI per neurology.
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
## 2015-10-08 DIAGNOSIS — M6289 Other specified disorders of muscle: Secondary | ICD-10-CM | POA: Diagnosis not present

## 2015-10-08 DIAGNOSIS — R531 Weakness: Secondary | ICD-10-CM | POA: Insufficient documentation

## 2015-10-08 LAB — COMPREHENSIVE METABOLIC PANEL
ALBUMIN: 3.8 g/dL (ref 3.5–5.0)
ALT: 15 U/L (ref 14–54)
ANION GAP: 10 (ref 5–15)
AST: 18 U/L (ref 15–41)
Alkaline Phosphatase: 65 U/L (ref 38–126)
BUN: 9 mg/dL (ref 6–20)
CHLORIDE: 107 mmol/L (ref 101–111)
CO2: 22 mmol/L (ref 22–32)
Calcium: 9 mg/dL (ref 8.9–10.3)
Creatinine, Ser: 0.71 mg/dL (ref 0.44–1.00)
GFR calc non Af Amer: >60 mL/min (ref >60)
Glucose, Bld: 101 mg/dL — ABNORMAL HIGH (ref 65–99)
POTASSIUM: 3.2 mmol/L — AB (ref 3.5–5.1)
SODIUM: 139 mmol/L (ref 135–145)
Total Bilirubin: 0.5 mg/dL (ref 0.3–1.2)
Total Protein: 7.5 g/dL (ref 6.5–8.1)

## 2015-10-08 LAB — RAPID URINE DRUG SCREEN, HOSP PERFORMED
AMPHETAMINES: NOT DETECTED
BARBITURATES: NOT DETECTED
BENZODIAZEPINES: NOT DETECTED
COCAINE: NOT DETECTED
Opiates: NOT DETECTED
TETRAHYDROCANNABINOL: NOT DETECTED

## 2015-10-08 LAB — ETHANOL

## 2015-10-08 LAB — URINALYSIS, ROUTINE W REFLEX MICROSCOPIC
GLUCOSE, UA: NEGATIVE mg/dL
HGB URINE DIPSTICK: NEGATIVE
Ketones, ur: 15 mg/dL — AB
Leukocytes, UA: NEGATIVE
Nitrite: NEGATIVE
PROTEIN: NEGATIVE mg/dL
Specific Gravity, Urine: 1.028 (ref 1.005–1.030)
pH: 6 (ref 5.0–8.0)

## 2015-10-08 LAB — CBG MONITORING, ED: GLUCOSE-CAPILLARY: 96 mg/dL (ref 65–99)

## 2015-10-08 MED ORDER — KETOROLAC TROMETHAMINE 30 MG/ML IJ SOLN
30.0000 mg | Freq: Once | INTRAMUSCULAR | Status: AC
  Administered 2015-10-08: 30 mg via INTRAVENOUS
  Filled 2015-10-08: qty 1

## 2015-10-08 MED ORDER — DEXAMETHASONE SODIUM PHOSPHATE 10 MG/ML IJ SOLN
10.0000 mg | Freq: Once | INTRAMUSCULAR | Status: AC
  Administered 2015-10-08: 10 mg via INTRAVENOUS
  Filled 2015-10-08: qty 1

## 2015-10-08 MED ORDER — METOCLOPRAMIDE HCL 5 MG/ML IJ SOLN
10.0000 mg | Freq: Once | INTRAMUSCULAR | Status: AC
  Administered 2015-10-08: 10 mg via INTRAVENOUS
  Filled 2015-10-08: qty 2

## 2015-10-08 MED ORDER — DIPHENHYDRAMINE HCL 50 MG/ML IJ SOLN
50.0000 mg | Freq: Once | INTRAMUSCULAR | Status: AC
  Administered 2015-10-08: 50 mg via INTRAVENOUS
  Filled 2015-10-08: qty 1

## 2015-10-08 MED ORDER — PROCHLORPERAZINE EDISYLATE 5 MG/ML IJ SOLN
10.0000 mg | Freq: Once | INTRAMUSCULAR | Status: AC
  Administered 2015-10-08: 10 mg via INTRAVENOUS
  Filled 2015-10-08: qty 2

## 2015-10-08 MED ORDER — METOCLOPRAMIDE HCL 10 MG PO TABS: 10.0000 mg | ORAL_TABLET | Freq: Three times a day (TID) | ORAL | Status: DC | PRN

## 2015-10-08 NOTE — Discharge Instructions (Signed)
General Headache Without Cause Laura Austin, your blood work and MRI results today were normal. Take reglan as needed for your headache.  You can also use tylenol or ibuprofen.  See neurology within 3 days for close follow up.  If symptoms worsen, come back to the ED immediately. Thank you. A headache is pain or discomfort felt around the head or neck area. There are many causes and types of headaches. In some cases, the cause may not be found.  HOME CARE  Managing Pain  Take over-the-counter and prescription medicines only as told by your doctor.  Lie down in a dark, quiet room when you have a headache.  If directed, apply ice to the head and neck area:  Put ice in a plastic bag.  Place a towel between your skin and the bag.  Leave the ice on for 20 minutes, 2-3 times per day.  Use a heating pad or hot shower to apply heat to the head and neck area as told by your doctor.  Keep lights dim if bright lights bother you or make your headaches worse. Eating and Drinking  Eat meals on a regular schedule.  Lessen how much alcohol you drink.  Lessen how much caffeine you drink, or stop drinking caffeine. General Instructions  Keep all follow-up visits as told by your doctor. This is important.  Keep a journal to find out if certain things bring on headaches. For example, write down:  What you eat and drink.  How much sleep you get.  Any change to your diet or medicines.  Relax by getting a massage or doing other relaxing activities.  Lessen stress.  Sit up straight. Do not tighten (tense) your muscles.  Do not use tobacco products. This includes cigarettes, chewing tobacco, or e-cigarettes. If you need help quitting, ask your doctor.  Exercise regularly as told by your doctor.  Get enough sleep. This often means 7-9 hours of sleep. GET HELP IF:  Your symptoms are not helped by medicine.  You have a headache that feels different than the other headaches.  You feel  sick to your stomach (nauseous) or you throw up (vomit).  You have a fever. GET HELP RIGHT AWAY IF:   Your headache becomes really bad.  You keep throwing up.  You have a stiff neck.  You have trouble seeing.  You have trouble speaking.  You have pain in the eye or ear.  Your muscles are weak or you lose muscle control.  You lose your balance or have trouble walking.  You feel like you will pass out (faint) or you pass out.  You have confusion.   This information is not intended to replace advice given to you by your health care provider. Make sure you discuss any questions you have with your health care provider.   Document Released: 07/25/2008 Document Revised: 07/07/2015 Document Reviewed: 02/08/2015 Elsevier Interactive Patient Education 2016 Elsevier Inc. Weakness Weakness is a lack of strength. You may feel weak all over your body or just in one part of your body. Weakness can be serious. In some cases, you may need more medical tests. HOME CARE  Rest.  Eat a well-balanced diet.  Try to exercise every day.  Only take medicines as told by your doctor. GET HELP RIGHT AWAY IF:   You cannot do your normal daily activities.  You cannot walk up and down stairs, or you feel very tired when you do so.  You have shortness of breath or  chest pain.  You have trouble moving parts of your body.  You have weakness in only one body part or on only one side of the body.  You have a fever.  You have trouble speaking or swallowing.  You cannot control when you pee (urinate) or poop (bowel movement).  You have black or bloody throw up (vomit) or poop.  Your weakness gets worse or spreads to other body parts.  You have new aches or pains. MAKE SURE YOU:   Understand these instructions.  Will watch your condition.  Will get help right away if you are not doing well or get worse.   This information is not intended to replace advice given to you by your health  care provider. Make sure you discuss any questions you have with your health care provider.   Document Released: 09/28/2008 Document Revised: 04/16/2012 Document Reviewed: 12/15/2011 Elsevier Interactive Patient Education [employer]2016 Elsevier Inc.

## 2015-10-08 NOTE — ED Notes (Signed)
Pt returned from MRI °

## 2015-10-08 NOTE — ED Notes (Signed)
Cancelled Code Stroke @ 0002

## 2015-10-11 ENCOUNTER — Ambulatory Visit (INDEPENDENT_AMBULATORY_CARE_PROVIDER_SITE_OTHER): Payer: Medicaid Other | Admitting: *Deleted

## 2015-10-11 VITALS — BP 124/78 | HR 83

## 2015-10-11 DIAGNOSIS — Z3042 Encounter for surveillance of injectable contraceptive: Secondary | ICD-10-CM

## 2015-10-11 MED ORDER — MEDROXYPROGESTERONE ACETATE 150 MG/ML IM SUSP
150.0000 mg | Freq: Once | INTRAMUSCULAR | Status: AC
  Administered 2015-10-11: 150 mg via INTRAMUSCULAR

## 2015-12-27 ENCOUNTER — Ambulatory Visit (INDEPENDENT_AMBULATORY_CARE_PROVIDER_SITE_OTHER): Payer: Medicaid Other

## 2015-12-27 DIAGNOSIS — Z3202 Encounter for pregnancy test, result negative: Secondary | ICD-10-CM | POA: Diagnosis not present

## 2015-12-27 DIAGNOSIS — Z3042 Encounter for surveillance of injectable contraceptive: Secondary | ICD-10-CM

## 2015-12-27 LAB — POCT PREGNANCY, URINE: Preg Test, Ur: NEGATIVE
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
## 2016-01-03 ENCOUNTER — Encounter: Payer: Self-pay | Admitting: Neurology

## 2016-01-03 ENCOUNTER — Ambulatory Visit (INDEPENDENT_AMBULATORY_CARE_PROVIDER_SITE_OTHER): Payer: Medicaid Other | Admitting: Neurology

## 2016-01-03 VITALS — BP 118/80 | HR 84 | Ht 60.0 in | Wt 310.0 lb

## 2016-01-03 DIAGNOSIS — G43709 Chronic migraine without aura, not intractable, without status migrainosus: Secondary | ICD-10-CM

## 2016-01-03 DIAGNOSIS — G43409 Hemiplegic migraine, not intractable, without status migrainosus: Secondary | ICD-10-CM | POA: Diagnosis not present

## 2016-01-03 DIAGNOSIS — IMO0002 Reserved for concepts with insufficient information to code with codable children: Secondary | ICD-10-CM

## 2016-01-03 MED ORDER — NAPROXEN SODIUM 550 MG PO TABS: 550.0000 mg | ORAL_TABLET | Freq: Two times a day (BID) | ORAL | Status: DC | PRN

## 2016-01-03 MED ORDER — TOPIRAMATE 25 MG PO TABS: ORAL_TABLET | ORAL | Status: DC

## 2016-01-03 MED ORDER — PROMETHAZINE HCL 12.5 MG PO TABS: 12.5000 mg | ORAL_TABLET | Freq: Four times a day (QID) | ORAL | Status: DC | PRN

## 2016-01-03 NOTE — Patient Instructions (Signed)
Migraine Recommendations: 1.  Start topiramate 25mg  tablet.  Take 1 tablet at bedtime for 7 days, then increase to 2 tablets at bedtime.  Take folic acid 1mg  daily as well.  Call in 4 weeks with update and we can adjust dose if needed. 2.  Take naproxen 550mg  at earliest onset of headache.  May repeat dose once in 12 hours if needed.  May take with promethazine 12.5mg  for nausea (may take up to every 6 hours as needed). 3.  Limit use of pain relievers to no more than 2 days out of the week.  These medications include acetaminophen, ibuprofen, triptans and narcotics.  This will help reduce risk of rebound headaches. 4.  Be aware of common food triggers such as processed sweets, processed foods with nitrites (such as deli meat, hot dogs, sausages), foods with MSG, alcohol (such as wine), chocolate, certain cheeses, certain fruits (dried fruits, some citrus fruit), vinegar, diet soda. 4.  Avoid caffeine 5.  Routine exercise 6.  Proper sleep hygiene 7.  Stay adequately hydrated with water 8.  Keep a headache diary. 9.  Maintain proper stress management. 10.  Do not skip meals.  Weight loss 11.  Consider supplements:  Magnesium oxide 400mg  to 600mg  daily, riboflavin 400mg , Coenzyme Q 10 100mg  three times daily 12.  Follow up in 3 to 4 months.
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
## 2016-01-10 ENCOUNTER — Ambulatory Visit (INDEPENDENT_AMBULATORY_CARE_PROVIDER_SITE_OTHER): Payer: Medicaid Other | Admitting: *Deleted

## 2016-01-10 VITALS — BP 125/79 | HR 98

## 2016-01-10 DIAGNOSIS — Z3202 Encounter for pregnancy test, result negative: Secondary | ICD-10-CM | POA: Diagnosis not present

## 2016-01-10 DIAGNOSIS — Z3042 Encounter for surveillance of injectable contraceptive: Secondary | ICD-10-CM | POA: Diagnosis present

## 2016-01-10 LAB — POCT PREGNANCY, URINE: Preg Test, Ur: NEGATIVE

## 2016-01-10 MED ORDER — MEDROXYPROGESTERONE ACETATE 150 MG/ML IM SUSP
150.0000 mg | Freq: Once | INTRAMUSCULAR | Status: AC
  Administered 2016-01-10: 150 mg via INTRAMUSCULAR

## 2016-01-10 NOTE — Progress Notes (Signed)
Here for pregnancy test and to get depo-provera. Pregnancy test was negative. States has not had intercourse in over 2 weeks. Will restart depo-provera today.

## 2016-01-27 ENCOUNTER — Other Ambulatory Visit: Payer: Self-pay | Admitting: Physician Assistant

## 2016-01-28 ENCOUNTER — Other Ambulatory Visit: Payer: Self-pay | Admitting: Physician Assistant

## 2016-01-28 DIAGNOSIS — R5381 Other malaise: Secondary | ICD-10-CM

## 2016-02-07 ENCOUNTER — Encounter: Payer: Self-pay | Admitting: Obstetrics & Gynecology

## 2016-02-07 ENCOUNTER — Ambulatory Visit (INDEPENDENT_AMBULATORY_CARE_PROVIDER_SITE_OTHER): Payer: Medicaid Other | Admitting: Obstetrics & Gynecology

## 2016-02-07 ENCOUNTER — Other Ambulatory Visit: Payer: Self-pay | Admitting: Internal Medicine

## 2016-02-07 ENCOUNTER — Other Ambulatory Visit: Payer: Self-pay | Admitting: Physician Assistant

## 2016-02-07 VITALS — BP 140/94 | HR 123 | Wt 304.1 lb

## 2016-02-07 DIAGNOSIS — N62 Hypertrophy of breast: Secondary | ICD-10-CM | POA: Diagnosis present

## 2016-02-07 DIAGNOSIS — Z1231 Encounter for screening mammogram for malignant neoplasm of breast: Secondary | ICD-10-CM

## 2016-02-07 DIAGNOSIS — IMO0002 Reserved for concepts with insufficient information to code with codable children: Secondary | ICD-10-CM

## 2016-02-07 NOTE — Progress Notes (Addendum)
Patient ID: Laura Austin, female   DOB: 23-Mar-1991, 25 y.o.   MRN: [id number] History:  25 y.o. [id number] here today for eval for breast reduction. Pt has completed child bearing.  Pt wears a size 48-50 I.  She reports that she has bleeding at times from her bras straps.  Pt reports should and back pain which has been getting progressively worse- 4 years.    Last delivery was in June 2016 and her breast got larger after her delivery.  She reports pain in her next from her posture of holding up her neck.  She reports that she is unable to bend over to care for her child.  She cannot sleep without a bra.  She feels that her breasts interfere with her ADL's.   The following portions of the patient's history were reviewed and updated as appropriate: allergies, current medications, past family history, past medical history, past social history, past surgical history and problem list.  Review of Systems:   Objective:  Physical Exam Blood pressure 140/94, pulse 123, weight 304 lb 1.6 oz (137.939 kg), not currently breastfeeding. Gen: NAD    Assessment & Plan:  Pendulous breast- refer to breast surgeon for consideration of reduction mammoplasty.  Master Touchet L. Harraway-Smith, M.D., Evern CoreFACOG

## 2016-02-07 NOTE — Patient Instructions (Signed)
Breast Reduction  Breast reduction (also called reduction mammoplasty) is surgery to reduce the size of your breasts. To do this, a surgeon removes fat, tissue, and excess skin. There are some common reasons for reduction mammoplasty. In some people, large breasts contribute to poor posture or chronic neck and back pain. Sometimes a rash develops under the breasts. Large breasts can also make it difficult to exercise. Before the procedure, you and your surgeon will decide on a new size and shape for your breasts. The surgery usually takes 2-4 hours.   After breast reduction, your breasts will be smaller. This should make movement easier.   LET YOUR HEALTH CARE PROVIDER KNOW ABOUT:  · Any allergies you have.  · All medicines you are taking, including vitamins, herbs, eye drops, creams, and over-the-counter medicines.  · Previous problems you or members of your family have had with the use of anesthetics.  · Any blood disorders you have.  · Previous surgeries you have had.  · Medical conditions you have.  · Any recent colds or fevers you have had.  RISKS AND COMPLICATIONS  Generally, this is a safe procedure. However, as with any procedure, complications can occur. Possible complications include:  · Excessive bleeding.  · Blood clots in your legs or lungs.  · Pool of blood forming near the wound (hematoma). This is caused by a broken blood vessel.  · Infection.  · Total or partial loss of feeling in your breasts or nipples. Discuss this with your surgeon before the surgery.  · Damage to the dark area around your nipple (areola).  · Loss of ability to breastfeed.  · Pneumonia. This is a risk with many surgeries.  BEFORE THE PROCEDURE  · Ask your health care provider about changing or stopping any regular medicines. Make sure you know when to stop taking nonsteroidal anti-inflammatory drugs (NSAIDs), vitamin E, and herbal supplements. These can increase your risk of bleeding during surgery.  · Do not eat or drink  anything for at least 8 hours before your surgery. Ask your health care provider about taking a sip of water with any approved medicines.  · Quit smoking. Smoking makes healing more difficult. It also makes scarring more likely.  · If you are older than 35 years, your surgeon might want you to have mammography before surgery.  · You might be asked to shower with an antibacterial soap before coming in for the procedure.  · Arrange for someone to drive you home.  PROCEDURE  · An IV access tube will be placed in one of your veins. You will be given medicine to make you sleep (general anesthetic).  · Cuts (incisions) will be made on your breasts.  · The surgeon will first remove fat, tissue, and excess skin. Then the surgeon will reshape your breasts. Your nipples might be removed, and then replaced, when the reshaping begins.  · The incisions will be closed with many tiny stitches.  · The surgeon may place small tubes under your skin to allow the wound to drain for a few days. You will be taught how to care for the drains.  · At the end of the surgery, your breasts will be bandaged securely with gauze and elastic bandages. This will protect your breasts for the first few days and promote healing.  AFTER THE PROCEDURE  · You will be taken to a recovery area. There, your recovery will be monitored. Your heartbeat, breathing rate, temperature, and blood pressure (vital signs) will   be checked.  · If you had outpatient surgery, you will be able to go home when you are stable and able to drink fluids. If you had inpatient surgery, you will be taken to your room.  · You might be given a special bra to wear while your breasts heal.     This information is not intended to replace advice given to you by your health care provider. Make sure you discuss any questions you have with your health care provider.     Document Released: 01/12/2009 Document Revised: 11/06/2014 Document Reviewed: 04/18/2013  Elsevier Interactive Patient  Education ©2016 [employer]

## 2016-02-21 ENCOUNTER — Ambulatory Visit: Payer: Medicaid Other

## 2016-03-29 ENCOUNTER — Ambulatory Visit (INDEPENDENT_AMBULATORY_CARE_PROVIDER_SITE_OTHER): Payer: Medicaid Other

## 2016-03-29 VITALS — BP 143/76 | HR 98

## 2016-03-29 DIAGNOSIS — Z3042 Encounter for surveillance of injectable contraceptive: Secondary | ICD-10-CM | POA: Diagnosis not present

## 2016-03-29 MED ORDER — MEDROXYPROGESTERONE ACETATE 150 MG/ML IM SUSP
150.0000 mg | Freq: Once | INTRAMUSCULAR | Status: AC
  Administered 2016-03-29: 150 mg via INTRAMUSCULAR

## 2016-03-29 NOTE — Progress Notes (Signed)
Pt presented to clinic today for a depo-provera. Pt tolerated well and will returned in three months for next injection.

## 2016-04-04 ENCOUNTER — Encounter (HOSPITAL_COMMUNITY): Payer: Self-pay

## 2016-04-04 ENCOUNTER — Emergency Department (HOSPITAL_COMMUNITY)
Admission: EM | Admit: 2016-04-04 | Discharge: 2016-04-04 | Disposition: A | Discharge status: Home / Self Care | Payer: Medicaid Other | Attending: Emergency Medicine | Admitting: Emergency Medicine

## 2016-04-04 DIAGNOSIS — Z8669 Personal history of other diseases of the nervous system and sense organs: Secondary | ICD-10-CM | POA: Insufficient documentation

## 2016-04-04 DIAGNOSIS — E669 Obesity, unspecified: Secondary | ICD-10-CM | POA: Diagnosis not present

## 2016-04-04 DIAGNOSIS — Z87440 Personal history of urinary (tract) infections: Secondary | ICD-10-CM | POA: Diagnosis not present

## 2016-04-04 DIAGNOSIS — Z862 Personal history of diseases of the blood and blood-forming organs and certain disorders involving the immune mechanism: Secondary | ICD-10-CM | POA: Insufficient documentation

## 2016-04-04 DIAGNOSIS — Z8619 Personal history of other infectious and parasitic diseases: Secondary | ICD-10-CM | POA: Insufficient documentation

## 2016-04-04 DIAGNOSIS — Z87891 Personal history of nicotine dependence: Secondary | ICD-10-CM | POA: Insufficient documentation

## 2016-04-04 DIAGNOSIS — R21 Rash and other nonspecific skin eruption: Secondary | ICD-10-CM | POA: Diagnosis present

## 2016-04-04 DIAGNOSIS — J45909 Unspecified asthma, uncomplicated: Secondary | ICD-10-CM | POA: Insufficient documentation

## 2016-04-04 DIAGNOSIS — L03312 Cellulitis of back [any part except buttock]: Secondary | ICD-10-CM | POA: Insufficient documentation

## 2016-04-04 MED ORDER — DOXYCYCLINE HYCLATE 100 MG PO CAPS: 100.0000 mg | ORAL_CAPSULE | Freq: Two times a day (BID) | ORAL | Status: DC

## 2016-04-04 MED ORDER — NAPROXEN 500 MG PO TABS: 500.0000 mg | ORAL_TABLET | Freq: Two times a day (BID) | ORAL | Status: AC
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
## 2016-04-04 NOTE — ED Notes (Signed)
Patient here with possible insect bite to left arm x 2 days. Reports itching, some drainage and irritation to same

## 2016-04-04 NOTE — Discharge Instructions (Signed)
You have been seen today for a skin infection called cellulitis. Refer to the attached literature for further information. Please take all of your antibiotics until finished!   You may develop abdominal discomfort or diarrhea from the antibiotic.  You may help offset this with probiotics which you can buy or get in yogurt. Do not eat or take the probiotics until 2 hours after your antibiotic. Follow up with PCP as needed. Return to ED should symptoms worsen or he develops concerning symptoms such as fever, vomiting, or difficulty breathing. Naproxen or ibuprofen for pain.

## 2016-04-04 NOTE — ED Notes (Signed)
Pt was 'bitten' by something while laying in bed 3 days ago. States it "stung" initially. Now is painful, blistered and draining clear fluid.

## 2016-04-19 ENCOUNTER — Other Ambulatory Visit (INDEPENDENT_AMBULATORY_CARE_PROVIDER_SITE_OTHER): Payer: Medicaid Other

## 2016-04-19 ENCOUNTER — Telehealth: Payer: Self-pay

## 2016-04-19 ENCOUNTER — Ambulatory Visit: Payer: Medicaid Other | Admitting: Neurology

## 2016-04-19 ENCOUNTER — Ambulatory Visit (INDEPENDENT_AMBULATORY_CARE_PROVIDER_SITE_OTHER): Payer: Medicaid Other | Admitting: Neurology

## 2016-04-19 ENCOUNTER — Encounter: Payer: Self-pay | Admitting: Neurology

## 2016-04-19 VITALS — BP 138/86 | HR 82 | Ht 60.0 in | Wt 311.0 lb

## 2016-04-19 DIAGNOSIS — R040 Epistaxis: Secondary | ICD-10-CM | POA: Diagnosis not present

## 2016-04-19 DIAGNOSIS — G43009 Migraine without aura, not intractable, without status migrainosus: Secondary | ICD-10-CM | POA: Diagnosis not present

## 2016-04-19 DIAGNOSIS — G43409 Hemiplegic migraine, not intractable, without status migrainosus: Secondary | ICD-10-CM

## 2016-04-19 LAB — COMPREHENSIVE METABOLIC PANEL
ALT: 13 U/L (ref 0–35)
AST: 14 U/L (ref 0–37)
Albumin: 3.9 g/dL (ref 3.5–5.2)
Alkaline Phosphatase: 67 U/L (ref 39–117)
BILIRUBIN TOTAL: 0.5 mg/dL (ref 0.2–1.2)
BUN: 9 mg/dL (ref 6–23)
CHLORIDE: 106 meq/L (ref 96–112)
CO2: 26 meq/L (ref 19–32)
CREATININE: 0.67 mg/dL (ref 0.40–1.20)
Calcium: 9.2 mg/dL (ref 8.4–10.5)
GFR: 136.28 mL/min (ref >60.00)
GLUCOSE: 99 mg/dL (ref 70–99)
Potassium: 3.8 mEq/L (ref 3.5–5.1)
Sodium: 138 mEq/L (ref 135–145)
Total Protein: 7.6 g/dL (ref 6.0–8.3)

## 2016-04-19 LAB — CBC WITH DIFFERENTIAL/PLATELET
BASOS ABS: 0 10*3/uL (ref 0.0–0.1)
Basophils Relative: 0.4 % (ref 0.0–3.0)
EOS ABS: 0.1 10*3/uL (ref 0.0–0.7)
Eosinophils Relative: 2 % (ref 0.0–5.0)
HCT: 35.5 % — ABNORMAL LOW (ref 36.0–46.0)
Hemoglobin: 11.4 g/dL — ABNORMAL LOW (ref 12.0–15.0)
LYMPHS ABS: 2.2 10*3/uL (ref 0.7–4.0)
Lymphocytes Relative: 28.6 % (ref 12.0–46.0)
MCHC: 32 g/dL (ref 30.0–36.0)
MCV: 77.3 fl — ABNORMAL LOW (ref 78.0–100.0)
Monocytes Absolute: 0.4 10*3/uL (ref 0.1–1.0)
Monocytes Relative: 5.3 % (ref 3.0–12.0)
NEUTROS ABS: 4.8 10*3/uL (ref 1.4–7.7)
NEUTROS PCT: 63.7 % (ref 43.0–77.0)
PLATELETS: 322 10*3/uL (ref 150.0–400.0)
RBC: 4.59 Mil/uL (ref 3.87–5.11)
RDW: 16.8 % — ABNORMAL HIGH (ref 11.5–15.5)
WBC: 7.6 10*3/uL (ref 4.0–10.5)

## 2016-04-19 MED ORDER — TOPIRAMATE 50 MG PO TABS
50.0000 mg | ORAL_TABLET | Freq: Every day | ORAL | Status: AC
Start: 2016-04-19 — End: ?
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
## 2016-04-19 NOTE — Patient Instructions (Addendum)
Migraine Recommendations: 1.  Continue topamax 50mg  at bedtime.   2.  Take naproxen 550mg  at earliest onset of headache.  May repeat in 12 hours if needed. 3.  Limit use of pain relievers to no more than 2 days out of the week.  These medications include acetaminophen, ibuprofen, triptans and narcotics.  This will help reduce risk of rebound headaches. 4.  Be aware of common food triggers such as processed sweets, processed foods with nitrites (such as deli meat, hot dogs, sausages), foods with MSG, alcohol (such as wine), chocolate, certain cheeses, certain fruits (dried fruits, some citrus fruit), vinegar, diet soda. 4.  Avoid caffeine 5.  Routine exercise/diet weight loss 6.  Proper sleep hygiene 7.  Stay adequately hydrated with water 8.  Keep a headache diary. 9.  Maintain proper stress management. 10.  Do not skip meals. 11.  Consider supplements:  Magnesium oxide 400mg  to 600mg  daily, riboflavin 400mg , Coenzyme Q 10 100mg  three times daily 12.  Check CBC and BMP. Your provider has requested that you have labwork completed today. Please go to [facility] ([id number]) on the second floor of this building before leaving the office today. You do not need to check in. If you are not called within 15 minutes please check with the front desk.  13.  Follow up in 3 months

## 2016-04-19 NOTE — Telephone Encounter (Signed)
-----   Message from Drema DallasAdam R Jaffe, DO sent at 04/19/2016 12:32 PM EDT ----- Blood work looks stable compared to prior labs.  Very mild anemia as seen on previous labs.

## 2016-04-19 NOTE — Telephone Encounter (Signed)
Results were left on pt's voicemail, with instructions to call back with any questions or concerns in relation to results.   

## 2016-06-14 ENCOUNTER — Ambulatory Visit: Payer: Medicaid Other

## 2016-07-27 ENCOUNTER — Ambulatory Visit: Payer: Medicaid Other | Admitting: Neurology

## 2016-11-05 IMAGING — MR MR HEAD W/O CM
8 of 10 series · 33 of 48 positions shown · non-contrast
Comparison: MRI brain January 31, 2012

CLINICAL DATA: Acute onset LEFT-sided headache, LEFT blurry vision
and gait imbalance beginning at 10 p.m. tonight. LEFT hand numbness.
History of stroke 1211.

EXAM:
MRI HEAD WITHOUT CONTRAST
TECHNIQUE: Multiplanar, multiecho pulse sequences of the brain and surrounding
structures were obtained without intravenous contrast.

[Series 3: DWI · axial · 3.6mm · 0.94mm/px · z∈[-79,+72]mm · 9 of 86 slices shown (1 of 3)]
[im 1/86]
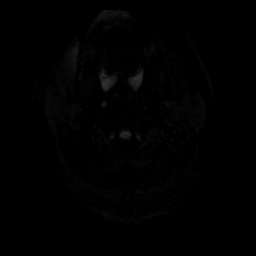
[im 11/86]
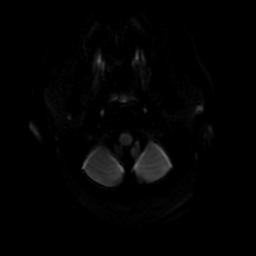
[im 22/86]
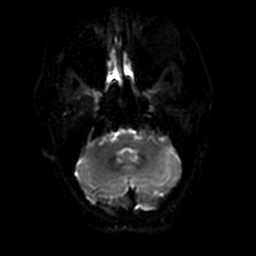
[im 32/86]
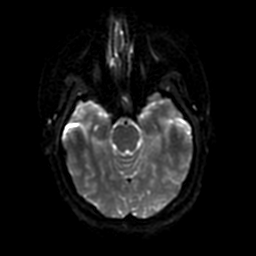
[im 43/86]
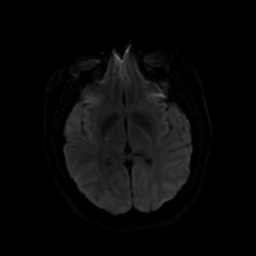
[im 54/86]
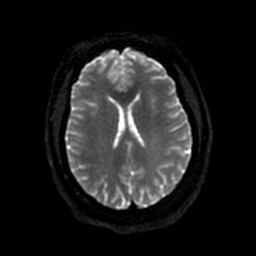
[im 64/86]
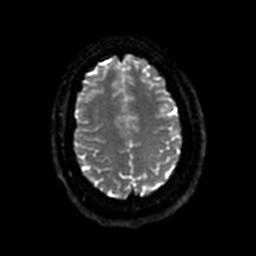
[im 75/86]
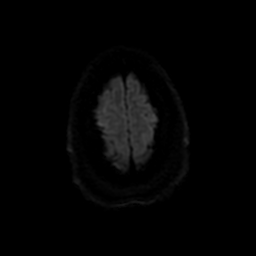
[im 86/86]
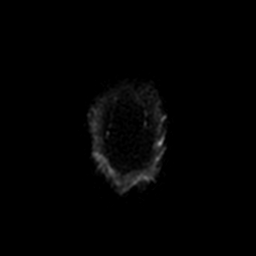

[Series 4: DWI · coronal · 5.0mm · 0.94mm/px · 7 of 68 slices shown (2 of 3)]
[im 1/68]
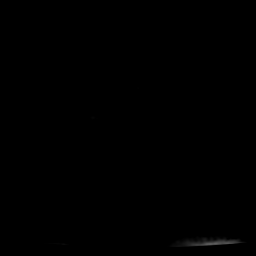
[im 12/68]
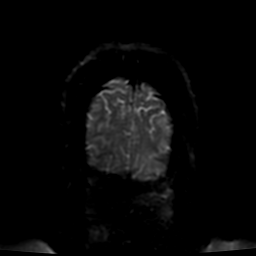
[im 23/68]
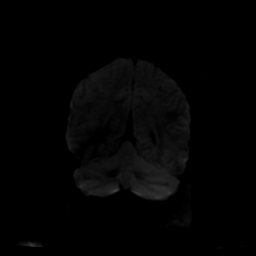
[im 34/68]
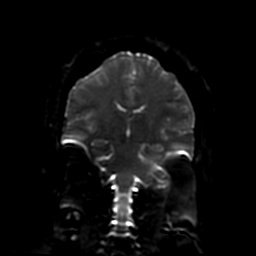
[im 45/68]
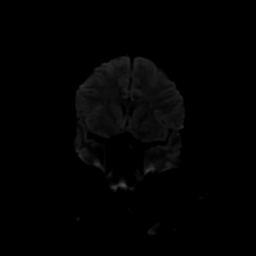
[im 56/68]
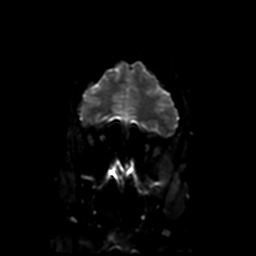
[im 68/68]
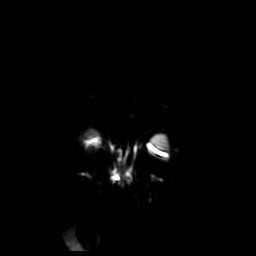

[Series 5: T2 · axial · 5.0mm · 0.47mm/px · z∈[-70,+80]mm · 3 of 26 slices shown (1 of 2)]
[im 1/26]
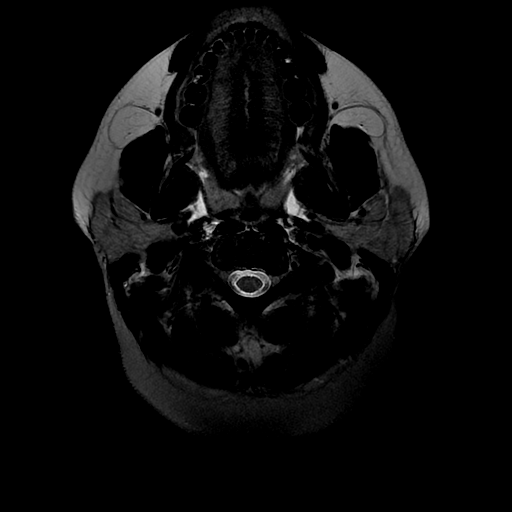
[im 13/26]
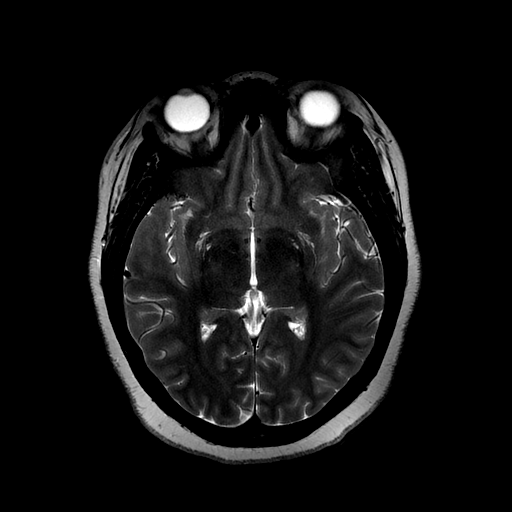
[im 26/26]
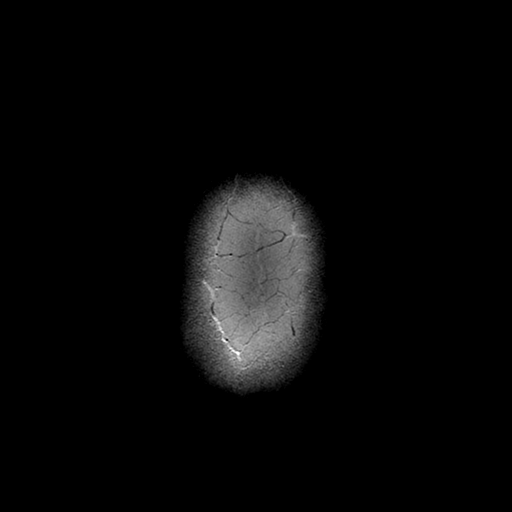

[Series 7: (person_name) · axial · 3.0mm · 0.47mm/px · z∈[-56,-40]mm · 2 of 100 slices shown]
[im 1/100]
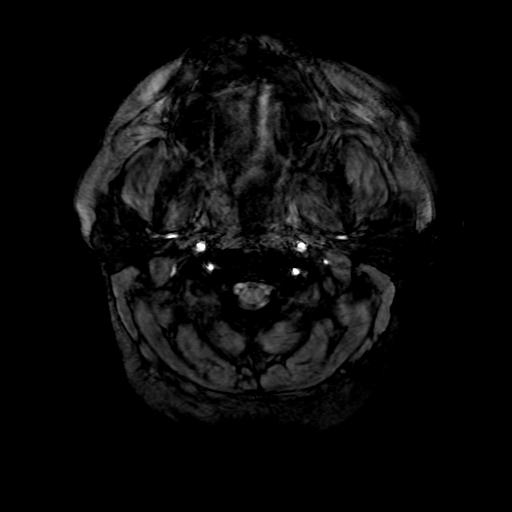
[im 12/100]
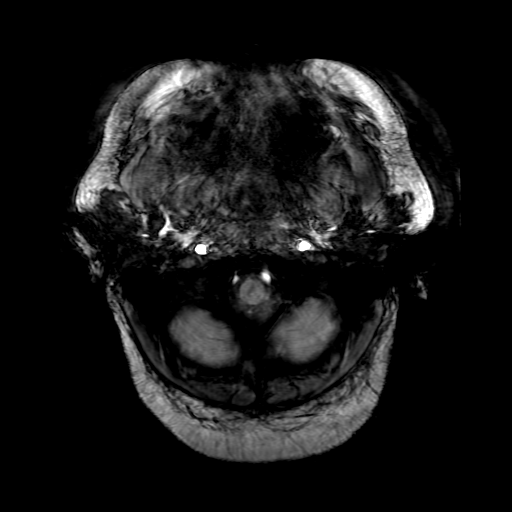

[Series 8: FLAIR · axial · 5.0mm · 0.47mm/px · z∈[-70,+80]mm · 3 of 26 slices shown (1 of 2)]
[im 1/26]
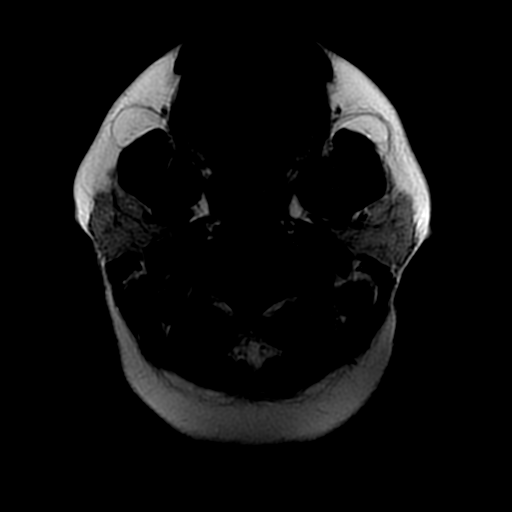
[im 13/26]
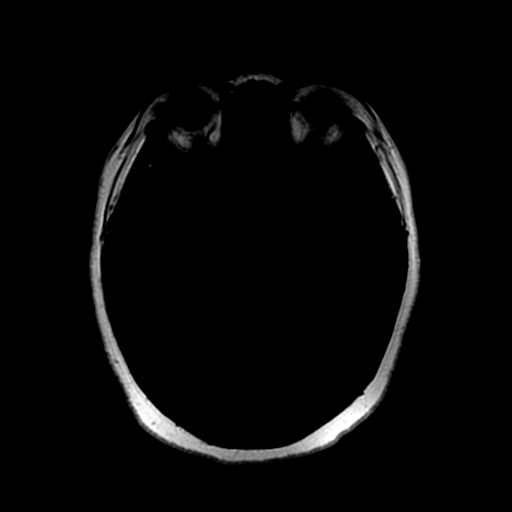
[im 26/26]
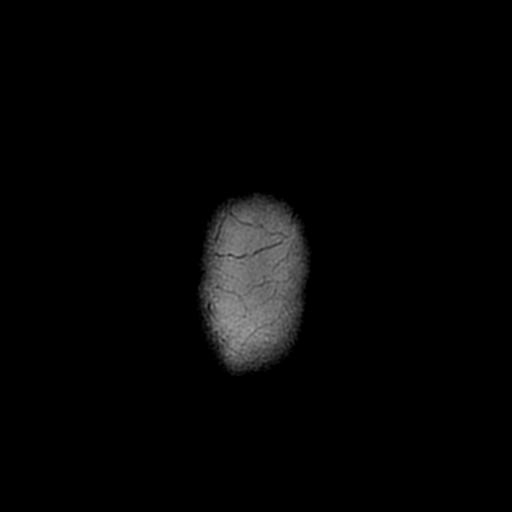

[Series 10: FLAIR · sagittal · 5.0mm · 0.47mm/px · 2 of 23 slices shown (2 of 2)]
[im 1/23]
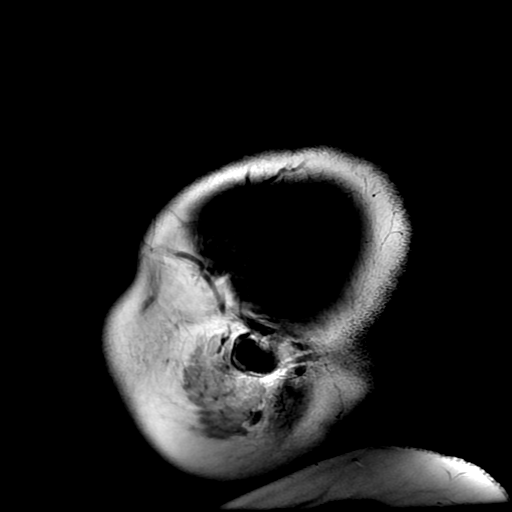
[im 23/23]
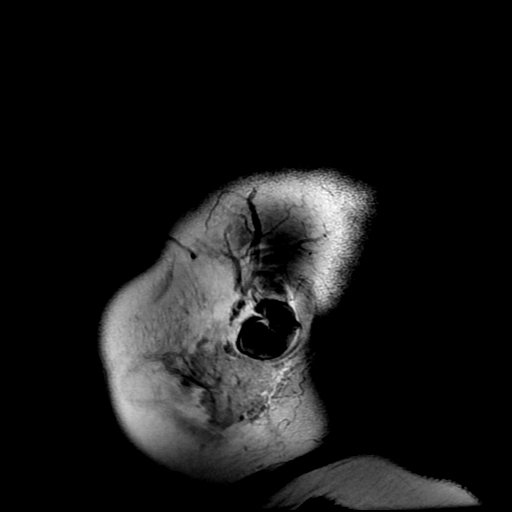

[Series 11: T2 · coronal · 5.0mm · 0.47mm/px · 3 of 27 slices shown (2 of 2)]
[im 1/27]
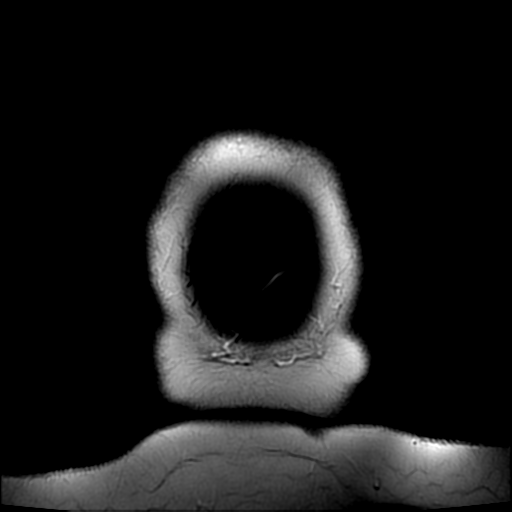
[im 14/27]
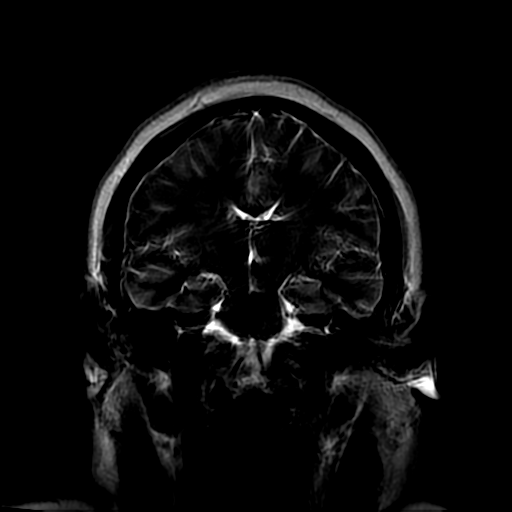
[im 27/27]
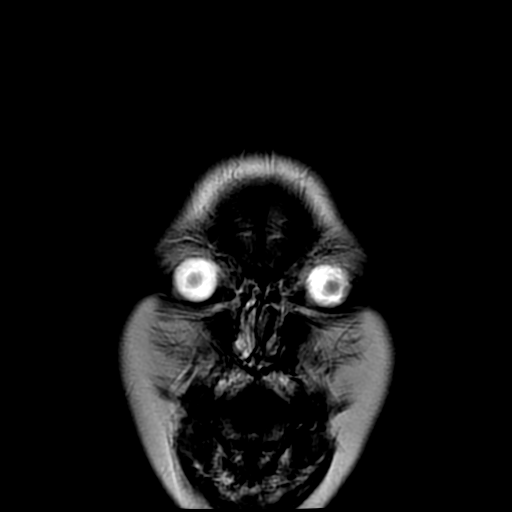

[Series 300: DWI · axial · 3.6mm · 0.94mm/px · z∈[-79,+69]mm · 4 of 41 slices shown (3 of 3)]
[im 1/41]
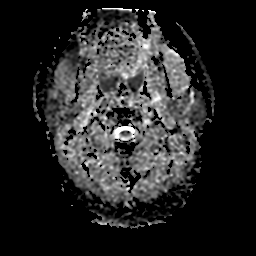
[im 14/41]
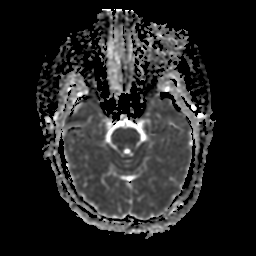
[im 27/41]
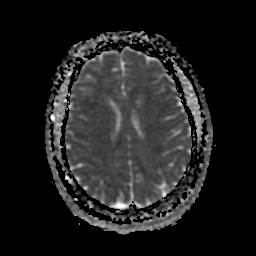
[im 41/41]
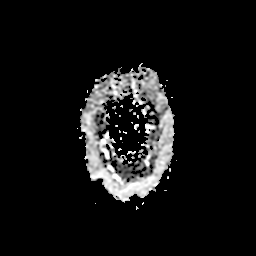

[33 of 48 positions shown; findings below may reference images not displayed]

FINDINGS: Mild motion degraded examination.

The ventricles and sulci are normal for patient's age. No abnormal
parenchymal signal, mass lesions, mass effect. No reduced diffusion
to suggest acute ischemia. No susceptibility artifact to suggest
hemorrhage.

No abnormal extra-axial fluid collections. No extra-axial masses
though, contrast enhanced sequences would be more sensitive. Normal
major intracranial vascular flow voids seen at the skull base.

Ocular globes and orbital contents are unremarkable though not
tailored for evaluation. No abnormal sellar expansion. Visualized
paranasal sinuses and mastoid air cells are well-aerated. Partial
empty sella. No suspicious calvarial bone marrow signal.
Craniocervical junction maintained.
IMPRESSION: Normal mildly motion degraded noncontrast MRI of the brain.
